# Patient Record
Sex: Female | Born: 1980
Health system: Southern US, Community
[De-identification: ages and names within clinical notes are randomized; demographics above are authoritative.]

## PROBLEM LIST (undated history)

## (undated) DIAGNOSIS — F419 Anxiety disorder, unspecified: Secondary | ICD-10-CM

## (undated) DIAGNOSIS — R350 Frequency of micturition: Secondary | ICD-10-CM

## (undated) DIAGNOSIS — N6082 Other benign mammary dysplasias of left breast: Secondary | ICD-10-CM

## (undated) DIAGNOSIS — T7840XA Allergy, unspecified, initial encounter: Secondary | ICD-10-CM

## (undated) DIAGNOSIS — F32A Depression, unspecified: Secondary | ICD-10-CM

## (undated) HISTORY — DX: Allergy, unspecified, initial encounter: T78.40XA

## (undated) HISTORY — PX: TONSILLECTOMY: SUR1361

## (undated) HISTORY — DX: Frequency of micturition: R35.0

## (undated) HISTORY — PX: WISDOM TOOTH EXTRACTION: SHX21

## (undated) HISTORY — DX: Anxiety disorder, unspecified: F41.9

## (undated) HISTORY — DX: Depression, unspecified: F32.A

---

## 1999-06-29 ENCOUNTER — Other Ambulatory Visit: Admission: RE | Admit: 1999-06-29 | Discharge: 1999-06-29 | Payer: Self-pay | Admitting: *Deleted

## 2000-08-22 ENCOUNTER — Other Ambulatory Visit: Admission: RE | Admit: 2000-08-22 | Discharge: 2000-08-22 | Payer: Self-pay | Admitting: *Deleted

## 2000-09-07 ENCOUNTER — Emergency Department (HOSPITAL_COMMUNITY): Admission: EM | Admit: 2000-09-07 | Discharge: 2000-09-07 | Payer: Self-pay | Admitting: Emergency Medicine

## 2001-09-16 ENCOUNTER — Other Ambulatory Visit: Admission: RE | Admit: 2001-09-16 | Discharge: 2001-09-16 | Payer: Self-pay | Admitting: Obstetrics and Gynecology

## 2004-11-23 ENCOUNTER — Emergency Department (HOSPITAL_COMMUNITY): Admission: EM | Admit: 2004-11-23 | Discharge: 2004-11-23 | Payer: Self-pay | Admitting: Family Medicine

## 2006-09-25 HISTORY — PX: NASAL SINUS SURGERY: SHX719

## 2007-01-16 ENCOUNTER — Ambulatory Visit (HOSPITAL_COMMUNITY): Admission: RE | Admit: 2007-01-16 | Discharge: 2007-01-16 | Payer: Self-pay | Admitting: Otolaryngology

## 2007-07-19 ENCOUNTER — Ambulatory Visit (HOSPITAL_BASED_OUTPATIENT_CLINIC_OR_DEPARTMENT_OTHER): Admission: RE | Admit: 2007-07-19 | Discharge: 2007-07-19 | Payer: Self-pay | Admitting: Otolaryngology

## 2007-07-19 ENCOUNTER — Encounter (INDEPENDENT_AMBULATORY_CARE_PROVIDER_SITE_OTHER): Payer: Self-pay | Admitting: Otolaryngology

## 2007-09-18 ENCOUNTER — Emergency Department (HOSPITAL_COMMUNITY): Admission: EM | Admit: 2007-09-18 | Discharge: 2007-09-18 | Payer: Self-pay | Admitting: Emergency Medicine

## 2008-02-24 ENCOUNTER — Emergency Department (HOSPITAL_COMMUNITY): Admission: EM | Admit: 2008-02-24 | Discharge: 2008-02-24 | Payer: Self-pay | Admitting: Emergency Medicine

## 2008-10-26 ENCOUNTER — Emergency Department (HOSPITAL_COMMUNITY): Admission: EM | Admit: 2008-10-26 | Discharge: 2008-10-26 | Payer: Self-pay | Admitting: Emergency Medicine

## 2011-01-10 LAB — URINALYSIS, ROUTINE W REFLEX MICROSCOPIC
Bilirubin Urine: NEGATIVE
Glucose, UA: NEGATIVE mg/dL
Hgb urine dipstick: NEGATIVE
Ketones, ur: >80 mg/dL — AB
Nitrite: NEGATIVE
Specific Gravity, Urine: 1.028 (ref 1.005–1.030)
pH: 7.5 (ref 5.0–8.0)

## 2011-02-07 NOTE — Op Note (Signed)
Sue Williams, Sue Williams             ACCOUNT NO.:  [id number]   MEDICAL RECORD NO.:  [id number]          PATIENT TYPE:  AMB   LOCATION:  DSC                          FACILITY:  [facility]   PHYSICIAN:  Christopher E. Ezzard Standing, M.D.DATE OF BIRTH:  06/27/1981   DATE OF PROCEDURE:  07/19/2007  DATE OF DISCHARGE:  07/19/2007                               OPERATIVE REPORT   PREOPERATIVE DIAGNOSIS:  Chronic ethmoid and maxillary sinus disease,  right side worse than left, turbinate hypertrophy.   POSTOPERATIVE DIAGNOSIS:  Chronic ethmoid and maxillary sinus disease,  right side worse than left, turbinate hypertrophy.   OPERATION:  Functional endoscopic sinus surgery with bilateral anterior  ethmoidectomy, bilateral maxillary ostial enlargement, bilateral  inferior turbinate reductions.   SURGEON:  Narda Bonds, M.D.   ANESTHESIA:  General endotracheal.   COMPLICATIONS:  None.   BRIEF CLINICAL NOTE:  Sue Williams is a 30 year old female who has  had chronic sinus problems, right side worse than left.  She had a CT  scan which showed mostly bilateral maxillary sinus problems, right side  worse than left, with some obstruction of the [facility] region on the right  side.  She is taken to the operating room at this time for bilateral  anterior ethmoidectomy, bilateral maxillary ostial enlargement and  inferior turbinate reductions.   DESCRIPTION OF PROCEDURE:  After adequate endotracheal anesthesia, the  patient received 1 gram Ancef intraoperatively as well as Decadron.  The  nose was prepped with cotton pledgets soaked in Afrin, and the middle  meatus area was injected with Xylocaine with epinephrine along with the  inferior turbinates.  First, the right side was approached.  Using the  microdebrider, the anterior ethmoid area was opened up.  The uncinate  process was incised with a sickle knife and removed anterior, and a few  of the posterior ethmoid areas were opened up on the right side.   The  maxillary ostia was identified.  This was enlarged with straight through-  cup forceps and backbiting cup forceps.  When opening the maxillary  ostium on the right side, thick mucus was aspirated from the maxillary  sinus.  The anterior and a few of the posterior ethmoid areas was opened  on the right side.  After adequate opening up of the maxillary ostia and  the anterior and a few of the posterior ethmoid areas on the right side,  this side was complete using a bipolar cautery.  The Elmed bipolar  cautery was used to reduce the right inferior turbinate.  The turbinate  was then outfractured.  A Kennedy sinus pack was placed on the right  side.  The left side was approached in a similar fashion.  Uncinate  process was incised.  The anterior and a few of the posterior ethmoid  areas were opened up with a microdebrider.  The left inferior turbinate  was reduced with the Elmed bipolar cautery and then outfractured.  Kennedy sinus pack was placed within the left ethmoid area and hydrated  with Xylocaine with epinephrine.  This completed the procedure.  Sue Williams was awoke from the anesthesia and transferred to  the recovery  room postoperatively doing well.   DISPOSITION:  Sue Williams was discharged home later this morning on  Tylenol and Darvocet-N 100 one to two q.4h. p.r.n. pain, Phenergan 25 mg  q.4h. p.r.n. nausea, Keflex 500 mg b.i.d. for 10 days and will follow up  in my office in 4 days for recheck to have the Kennedy sinus packs  removed.           ______________________________  Kristine Garbe Ezzard Standing, M.D.     CEN/MEDQ  D:  07/19/2007  T:  07/20/2007  Job:  [id number]

## 2011-05-26 ENCOUNTER — Emergency Department (HOSPITAL_COMMUNITY)
Admission: EM | Admit: 2011-05-26 | Discharge: 2011-05-27 | Disposition: A | Discharge status: Home / Self Care | Payer: Self-pay | Attending: Emergency Medicine | Admitting: Emergency Medicine

## 2011-05-26 DIAGNOSIS — R5383 Other fatigue: Secondary | ICD-10-CM | POA: Insufficient documentation

## 2011-05-26 DIAGNOSIS — R05 Cough: Secondary | ICD-10-CM | POA: Insufficient documentation

## 2011-05-26 DIAGNOSIS — R51 Headache: Secondary | ICD-10-CM | POA: Insufficient documentation

## 2011-05-26 DIAGNOSIS — R509 Fever, unspecified: Secondary | ICD-10-CM | POA: Insufficient documentation

## 2011-05-26 DIAGNOSIS — R5381 Other malaise: Secondary | ICD-10-CM | POA: Insufficient documentation

## 2011-05-26 DIAGNOSIS — R059 Cough, unspecified: Secondary | ICD-10-CM | POA: Insufficient documentation

## 2011-05-27 ENCOUNTER — Emergency Department (HOSPITAL_COMMUNITY): Payer: Self-pay

## 2011-05-27 LAB — POCT PREGNANCY, URINE: Preg Test, Ur: NEGATIVE

## 2011-05-27 LAB — URINALYSIS, ROUTINE W REFLEX MICROSCOPIC
Ketones, ur: NEGATIVE mg/dL
Nitrite: NEGATIVE
pH: 5.5 (ref 5.0–8.0)

## 2011-05-27 LAB — URINE MICROSCOPIC-ADD ON

## 2011-06-30 LAB — POCT RAPID STREP A: Streptococcus, Group A Screen (Direct): NEGATIVE

## 2011-07-05 LAB — POCT HEMOGLOBIN-HEMACUE: Operator id: 123881

## 2013-08-01 ENCOUNTER — Encounter: Payer: Self-pay | Admitting: Nurse Practitioner

## 2013-08-15 ENCOUNTER — Encounter: Payer: Self-pay | Admitting: Nurse Practitioner

## 2013-08-15 ENCOUNTER — Ambulatory Visit (INDEPENDENT_AMBULATORY_CARE_PROVIDER_SITE_OTHER): Payer: 59 | Admitting: Nurse Practitioner

## 2013-08-15 VITALS — BP 100/60 | HR 82 | Resp 18 | Ht 67.0 in | Wt 139.0 lb

## 2013-08-15 DIAGNOSIS — Z0000 Encounter for general adult medical examination without abnormal findings: Secondary | ICD-10-CM

## 2013-08-15 DIAGNOSIS — Z01419 Encounter for gynecological examination (general) (routine) without abnormal findings: Secondary | ICD-10-CM

## 2013-08-15 LAB — HEMOGLOBIN, FINGERSTICK: Hemoglobin, fingerstick: 14.8 g/dL (ref 12.0–16.0)

## 2013-08-15 MED ORDER — NORETHINDRONE 0.35 MG PO TABS: 1.0000 | ORAL_TABLET | Freq: Every day | ORAL | Status: DC

## 2013-08-15 NOTE — Patient Instructions (Signed)

## 2013-08-15 NOTE — Progress Notes (Signed)
32 y.o. G2P0020 Single Caucasian Fe here for annual exam. This patient is a 1 pack per day smoker.  In past she has used Nuva ring and did well.  Non compliant to daily method birth control in the past.  Her menses last 3 days moderate flow with every other month a little heavier.  Some PMS.  Menses started last pm. Same partner for two years.  Last STD's a year ago was negative.  Maybe long term relationship and wanting pregnancy in the future. Also has a sebaceous cyst on left breast for a few days.  It is getting better since being on antibiotics for pneumonia.  Patient's last menstrual period was 08/15/2013.          Sexually active: yes  The current method of family planning is none.    Exercising: yes  Cardio, Running 3-4 days a week Health Maintenance: Pap:  2010 History of abnormal Pap:  no TDaP:  Current Screening Labs:   Hb today: 14.8, Urine today: 50 blood (on menstrual cycle)   reports that she has been smoking Cigarettes.  She has been smoking about 1.00 pack per day. She has never used smokeless tobacco. She reports that she does not drink alcohol or use illicit drugs.  History reviewed. No pertinent past medical history.  Past Surgical History  Procedure Laterality Date  . Nasal sinus surgery  2008  . Wisdom tooth extraction    . Tonsillectomy      Current Outpatient Prescriptions  Medication Sig Dispense Refill  . chlorpheniramine-HYDROcodone (TUSSIONEX) 10-8 MG/5ML LQCR       . fluconazole (DIFLUCAN) 150 MG tablet       . levofloxacin (LEVAQUIN) 500 MG tablet       . norethindrone (MICRONOR,CAMILA,ERRIN) 0.35 MG tablet Take 1 tablet (0.35 mg total) by mouth daily.  3 Package  3  . PREVIDENT 5000 PLUS 1.1 % CREA dental cream        No current facility-administered medications for this visit.    Family History  Problem Relation Age of Onset  . Heart disease Father   . Diabetes Maternal Grandmother   . Heart disease Maternal Grandfather     ROS:  Pertinent  items are noted in HPI.  Otherwise, a comprehensive ROS was negative.  Exam:   BP 100/60  Pulse 82  Resp 18  Ht 5\' 7"  (1.702 m)  Wt 139 lb (63.05 kg)  BMI 21.77 kg/m2  LMP 08/15/2013  Weight change: @WEIGHT  CHANGE@ Height:   Height: 5\' 7"  (170.2 cm)  Ht Readings from Last 3 Encounters:  08/15/13 5\' 7"  (1.702 m)    General appearance: alert, cooperative and appears stated age Head: Normocephalic, without obvious abnormality, atraumatic Neck: no adenopathy, supple, symmetrical, trachea midline and thyroid normal to inspection and palpation Lungs: clear to auscultation bilaterally Breasts: normal appearance, no masses or tenderness, positive findings: left breast there is a superficial sebaceous cyst without exudate and is improved per patietn. It is located at the aerola  at 2:00 position at a montgomery gland. Heart: regular rate and rhythm Abdomen: soft, non-tender; bowel sounds normal; no masses,  no organomegaly Extremities: extremities normal, atraumatic, no cyanosis or edema Skin: Skin color, texture, turgor normal. No rashes or lesions Lymph nodes: Cervical, supraclavicular, and axillary nodes normal. No abnormal inguinal nodes palpated Neurologic: Grossly normal   Pelvic: External genitalia:  no lesions              Urethra:  normal appearing urethra with  no masses, tenderness or lesions              Bartholin's and Skene's: normal                 Vagina: normal appearing vagina with normal color and moderate vaginal bleeding, no lesions              Cervix: anteverted              Pap taken: yes Bimanual Exam:  Uterus:  normal size, contour, position, consistency, mobility, non-tender              Adnexa: no mass, fullness, tenderness               Rectovaginal: Confirms               Anus:  normal sphincter tone, no lesions  A:  Well Woman with normal exam  Contraceptive needs  Smoker  Sebaceous cyst left areola that is getting better on antibiotics.    P:   Pap  smear with guidelines done today  Discussion of various methods of birth control including POP, IUD, Implanon, Depo Provera.  Will start on Micronor for birth control  - reviewed potential side effects, start date, and BUM  Counseled on breast self exam, adequate intake of calcium and vitamin D, diet and exercise  If area on breast does not go away to call back.  Will use warm compresses prn and not to squeeze any further. return annually or prn  An After Visit Summary was printed and given to the patient.

## 2013-08-18 NOTE — Progress Notes (Signed)
Encounter reviewed by Dr. Janaki Exley Silva.  

## 2013-08-19 LAB — IPS PAP TEST WITH HPV

## 2014-06-21 ENCOUNTER — Other Ambulatory Visit: Payer: Self-pay | Admitting: Nurse Practitioner

## 2014-06-29 ENCOUNTER — Telehealth: Payer: Self-pay | Admitting: Nurse Practitioner

## 2014-06-29 MED ORDER — NORETHINDRONE 0.35 MG PO TABS: 1.0000 | ORAL_TABLET | Freq: Every day | ORAL | Status: DC

## 2014-06-29 NOTE — Telephone Encounter (Signed)
norethindrone (MICRONOR,CAMILA,ERRIN) 0.35 MG tablet  Take 1 tablet (0.35 mg total) by mouth daily., Starting 08/15/2013, Until Discontinued, Normal, Last Dose: Not Recorded   Patient called to requests refill on the above medication. AEX 08/28/14 with Ria CommentPatricia Grubb, NP.  [facility]

## 2014-06-29 NOTE — Telephone Encounter (Signed)
Last refilled/AEX: 08/15/13 with Ms. Patty #3 packs with 3 rfs AEX Scheduled: 08/28/14 with Ms. Patty  Norethindrone 0.35 #3 packs with no rfs sent to [facility].  Patient is aware.  Routed to provider for review, encounter closed.

## 2014-07-27 ENCOUNTER — Encounter: Payer: Self-pay | Admitting: Nurse Practitioner

## 2014-08-28 ENCOUNTER — Ambulatory Visit: Payer: 59 | Admitting: Nurse Practitioner

## 2014-09-10 ENCOUNTER — Telehealth: Payer: Self-pay | Admitting: Nurse Practitioner

## 2014-09-10 ENCOUNTER — Ambulatory Visit: Payer: 59 | Admitting: Nurse Practitioner

## 2014-09-10 NOTE — Telephone Encounter (Signed)
Patient canceled her aex appointment today due to illness. Patient rescheduled to 10/01/2014.

## 2014-10-01 ENCOUNTER — Ambulatory Visit (INDEPENDENT_AMBULATORY_CARE_PROVIDER_SITE_OTHER): Payer: Commercial Managed Care - PPO | Admitting: Nurse Practitioner

## 2014-10-01 ENCOUNTER — Encounter: Payer: Self-pay | Admitting: Nurse Practitioner

## 2014-10-01 VITALS — BP 94/66 | HR 56 | Ht 67.0 in | Wt 137.0 lb

## 2014-10-01 DIAGNOSIS — R319 Hematuria, unspecified: Secondary | ICD-10-CM

## 2014-10-01 DIAGNOSIS — Z01419 Encounter for gynecological examination (general) (routine) without abnormal findings: Secondary | ICD-10-CM

## 2014-10-01 DIAGNOSIS — N6002 Solitary cyst of left breast: Secondary | ICD-10-CM

## 2014-10-01 DIAGNOSIS — Z0000 Encounter for general adult medical examination without abnormal findings: Secondary | ICD-10-CM

## 2014-10-01 LAB — POCT URINALYSIS DIPSTICK
Bilirubin, UA: NEGATIVE
GLUCOSE UA: NEGATIVE
Ketones, UA: NEGATIVE
LEUKOCYTES UA: NEGATIVE
Nitrite, UA: NEGATIVE
PH UA: 7
Protein, UA: NEGATIVE
UROBILINOGEN UA: NEGATIVE

## 2014-10-01 MED ORDER — NORETHINDRONE 0.35 MG PO TABS: 1.0000 | ORAL_TABLET | Freq: Every day | ORAL | Status: DC

## 2014-10-01 NOTE — Progress Notes (Signed)
Patient ID: Sue Williams, female   DOB: 06-25-1981, 34 y.o.   MRN: [id number] 34 y.o. G2P0020 Single Caucasian Fe here for annual exam.  Menses since on Micronor.  Last  for 3-4 days moderate to light. Same partner for 4 years.  Patient's last menstrual period was 09/23/2014 (exact date).          Sexually active: yes  The current method of family planning is POP (progesterone only).  Exercising: yes Cardio, Running 3-4 days a week Smoking:  Yes, 1/2 ppd  Health Maintenance: Pap: 08/15/13, negative with neg HR HPV History of abnormal Pap: no TDaP: Current Screening Labs:  HB:  14.3  Urine:  Trace RBC   reports that she has been smoking Cigarettes.  She has been smoking about 1.00 pack per day. She has never used smokeless tobacco. She reports that she does not drink alcohol or use illicit drugs.  History reviewed. No pertinent past medical history.  Past Surgical History  Procedure Laterality Date  . Nasal sinus surgery  2008  . Wisdom tooth extraction    . Tonsillectomy      Current Outpatient Prescriptions  Medication Sig Dispense Refill  . norethindrone (MICRONOR,CAMILA,ERRIN) 0.35 MG tablet Take 1 tablet (0.35 mg total) by mouth daily. 3 Package 3   No current facility-administered medications for this visit.    Family History  Problem Relation Age of Onset  . Heart disease Father   . Diabetes Maternal Grandmother   . Heart disease Maternal Grandfather     ROS:  Pertinent items are noted in HPI.  Otherwise, a comprehensive ROS was negative.  Exam:   BP 94/66 mmHg  Pulse 56  Ht 5\' 7"  (1.702 m)  Wt 137 lb (62.143 kg)  BMI 21.45 kg/m2  LMP 09/23/2014 (Exact Date) Height: 5\' 7"  (170.2 cm)  Ht Readings from Last 3 Encounters:  10/01/14 5\' 7"  (1.702 m)  08/15/13 5\' 7"  (1.702 m)    General appearance: alert, cooperative and appears stated age Head: Normocephalic, without obvious abnormality, atraumatic Neck: no adenopathy, supple, symmetrical, trachea  midline and thyroid normal to inspection and palpation Lungs: clear to auscultation bilaterally Breasts: normal appearance, no masses or tenderness Heart: regular rate and rhythm Abdomen: soft, non-tender; no masses,  no organomegaly Extremities: extremities normal, atraumatic, no cyanosis or edema Skin: Skin color, texture, turgor normal. No rashes or lesions Lymph nodes: Cervical, supraclavicular, and axillary nodes normal. No abnormal inguinal nodes palpated Neurologic: Grossly normal   Pelvic: External genitalia:  no lesions              Urethra:  normal appearing urethra with no masses, tenderness or lesions              Bartholin's and Skene's: normal                 Vagina: normal appearing vagina with normal color and discharge, no lesions              Cervix: anteverted              Pap taken: No. Bimanual Exam:  Uterus:  normal size, contour, position, consistency, mobility, non-tender              Adnexa: no mass, fullness, tenderness               Rectovaginal: Confirms               Anus:  normal sphincter tone, no lesions  A:  Well Woman with normal exam  Contraceptive with POP Smoker  Hematuria Sebaceous cyst left areola that was removed by dermatologist - still present and wants removed.  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

## 2014-10-01 NOTE — Patient Instructions (Signed)

## 2014-10-02 ENCOUNTER — Telehealth: Payer: Self-pay | Admitting: Nurse Practitioner

## 2014-10-02 LAB — URINALYSIS, MICROSCOPIC ONLY
Bacteria, UA: NONE SEEN
CRYSTALS: NONE SEEN
Casts: NONE SEEN

## 2014-10-02 LAB — HEMOGLOBIN, FINGERSTICK: HEMOGLOBIN, FINGERSTICK: 14.3 g/dL (ref 12.0–16.0)

## 2014-10-02 NOTE — Telephone Encounter (Signed)
[facility]. Message left to return call to [facility] at [phone].  Office visit notes faxed.

## 2014-10-02 NOTE — Telephone Encounter (Signed)
Lamar LaundrySonya from [facility] calling to find out if patient has recent mammogram or note concerning breast cyst. [phone]

## 2014-10-04 NOTE — Progress Notes (Signed)
Encounter reviewed by Dr. Arlester Keehan Silva.  

## 2014-10-05 ENCOUNTER — Telehealth: Payer: Self-pay | Admitting: Emergency Medicine

## 2014-10-05 NOTE — Telephone Encounter (Signed)
Patient returned call, information given.  She will call back with any concerns.  Routing to provider for final review. Patient agreeable to disposition. Will close encounter

## 2014-10-05 NOTE — Telephone Encounter (Signed)
Message left to return call to [facility] at [phone].   Spoke with Lamar LaundrySonya from [facility]. She wanted to confirm patient did not have abcess or need emergency appointment with Dr. Donell BeersByerly.   Spoke with Lauro FranklinPatricia Rolen-Grubb, FNP, advised appointment needed not urgent. Feels cyst is superficial and no imaging required at this time.   [facility] with Dr. Donell BeersByerly for 10-30-14 at 0830. Bring ID and insurance cards. Call [phone] if needs to change appointment.

## 2014-11-02 ENCOUNTER — Other Ambulatory Visit (INDEPENDENT_AMBULATORY_CARE_PROVIDER_SITE_OTHER): Payer: Self-pay | Admitting: General Surgery

## 2014-12-02 ENCOUNTER — Encounter (HOSPITAL_BASED_OUTPATIENT_CLINIC_OR_DEPARTMENT_OTHER): Payer: Self-pay | Admitting: *Deleted

## 2014-12-10 ENCOUNTER — Ambulatory Visit (HOSPITAL_BASED_OUTPATIENT_CLINIC_OR_DEPARTMENT_OTHER): Payer: Commercial Managed Care - PPO | Admitting: Certified Registered"

## 2014-12-10 ENCOUNTER — Encounter (HOSPITAL_BASED_OUTPATIENT_CLINIC_OR_DEPARTMENT_OTHER): Payer: Self-pay | Admitting: Certified Registered"

## 2014-12-10 ENCOUNTER — Ambulatory Visit (HOSPITAL_BASED_OUTPATIENT_CLINIC_OR_DEPARTMENT_OTHER)
Admission: RE | Admit: 2014-12-10 | Discharge: 2014-12-10 | Disposition: A | Discharge status: Home / Self Care | Payer: Commercial Managed Care - PPO | Source: Ambulatory Visit | Attending: General Surgery | Admitting: General Surgery

## 2014-12-10 ENCOUNTER — Encounter (HOSPITAL_BASED_OUTPATIENT_CLINIC_OR_DEPARTMENT_OTHER)
Admission: RE | Disposition: A | Discharge status: Home / Self Care | Payer: Self-pay | Source: Ambulatory Visit | Attending: General Surgery

## 2014-12-10 DIAGNOSIS — N6082 Other benign mammary dysplasias of left breast: Secondary | ICD-10-CM | POA: Diagnosis not present

## 2014-12-10 DIAGNOSIS — F17210 Nicotine dependence, cigarettes, uncomplicated: Secondary | ICD-10-CM | POA: Diagnosis not present

## 2014-12-10 DIAGNOSIS — N63 Unspecified lump in breast: Secondary | ICD-10-CM | POA: Diagnosis present

## 2014-12-10 DIAGNOSIS — Z888 Allergy status to other drugs, medicaments and biological substances status: Secondary | ICD-10-CM | POA: Diagnosis not present

## 2014-12-10 DIAGNOSIS — Z886 Allergy status to analgesic agent status: Secondary | ICD-10-CM | POA: Diagnosis not present

## 2014-12-10 HISTORY — PX: BREAST CYST EXCISION: SHX579

## 2014-12-10 HISTORY — DX: Other benign mammary dysplasias of left breast: N60.82

## 2014-12-10 SURGERY — EXCISION, CYST, BREAST
Anesthesia: General | Site: Breast | Laterality: Left

## 2014-12-10 MED ORDER — LIDOCAINE-EPINEPHRINE (PF) 1 %-1:200000 IJ SOLN
INTRAMUSCULAR | Status: AC
  Filled 2014-12-10: qty 10

## 2014-12-10 MED ORDER — MIDAZOLAM HCL 5 MG/5ML IJ SOLN
INTRAMUSCULAR | Status: DC | PRN
  Administered 2014-12-10: 2 mg via INTRAVENOUS

## 2014-12-10 MED ORDER — SODIUM CHLORIDE 0.9 % IJ SOLN: 3.0000 mL | Freq: Two times a day (BID) | INTRAMUSCULAR | Status: DC

## 2014-12-10 MED ORDER — PROMETHAZINE HCL 25 MG/ML IJ SOLN: 6.2500 mg | INTRAMUSCULAR | Status: DC | PRN

## 2014-12-10 MED ORDER — OXYCODONE HCL 5 MG/5ML PO SOLN: 5.0000 mg | Freq: Once | ORAL | Status: AC | PRN

## 2014-12-10 MED ORDER — KETOROLAC TROMETHAMINE 30 MG/ML IJ SOLN
30.0000 mg | Freq: Once | INTRAMUSCULAR | Status: AC | PRN
  Administered 2014-12-10: 30 mg via INTRAVENOUS

## 2014-12-10 MED ORDER — LACTATED RINGERS IV SOLN
INTRAVENOUS | Status: DC
  Administered 2014-12-10: 10 mL/h via INTRAVENOUS
  Administered 2014-12-10: 10:00:00 via INTRAVENOUS

## 2014-12-10 MED ORDER — PROPOFOL 10 MG/ML IV EMUL
INTRAVENOUS | Status: AC
  Filled 2014-12-10: qty 50

## 2014-12-10 MED ORDER — CEFAZOLIN SODIUM-DEXTROSE 2-3 GM-% IV SOLR
INTRAVENOUS | Status: DC | PRN
  Administered 2014-12-10: 2 g via INTRAVENOUS

## 2014-12-10 MED ORDER — BUPIVACAINE HCL (PF) 0.25 % IJ SOLN
INTRAMUSCULAR | Status: DC | PRN
  Administered 2014-12-10: 6 mL

## 2014-12-10 MED ORDER — SODIUM CHLORIDE 0.9 % IJ SOLN: 3.0000 mL | INTRAMUSCULAR | Status: DC | PRN

## 2014-12-10 MED ORDER — OXYCODONE HCL 5 MG PO TABS
5.0000 mg | ORAL_TABLET | Freq: Once | ORAL | Status: AC | PRN
  Administered 2014-12-10: 5 mg via ORAL

## 2014-12-10 MED ORDER — BUPIVACAINE HCL (PF) 0.25 % IJ SOLN
INTRAMUSCULAR | Status: AC
  Filled 2014-12-10: qty 30

## 2014-12-10 MED ORDER — FENTANYL CITRATE 0.05 MG/ML IJ SOLN
INTRAMUSCULAR | Status: DC | PRN
  Administered 2014-12-10: 50 ug via INTRAVENOUS
  Administered 2014-12-10: 100 ug via INTRAVENOUS
  Administered 2014-12-10 (×3): 50 ug via INTRAVENOUS

## 2014-12-10 MED ORDER — CEFAZOLIN SODIUM-DEXTROSE 2-3 GM-% IV SOLR
INTRAVENOUS | Status: AC
  Filled 2014-12-10: qty 50

## 2014-12-10 MED ORDER — ACETAMINOPHEN 325 MG PO TABS: 650.0000 mg | ORAL_TABLET | ORAL | Status: DC | PRN

## 2014-12-10 MED ORDER — HYDROMORPHONE HCL 1 MG/ML IJ SOLN: 0.2500 mg | INTRAMUSCULAR | Status: DC | PRN

## 2014-12-10 MED ORDER — FENTANYL CITRATE 0.05 MG/ML IJ SOLN
INTRAMUSCULAR | Status: AC
  Filled 2014-12-10: qty 6

## 2014-12-10 MED ORDER — ACETAMINOPHEN 650 MG RE SUPP: 650.0000 mg | RECTAL | Status: DC | PRN

## 2014-12-10 MED ORDER — MIDAZOLAM HCL 2 MG/2ML IJ SOLN
INTRAMUSCULAR | Status: AC
  Filled 2014-12-10: qty 2

## 2014-12-10 MED ORDER — FENTANYL CITRATE 0.05 MG/ML IJ SOLN: 50.0000 ug | INTRAMUSCULAR | Status: DC | PRN

## 2014-12-10 MED ORDER — OXYCODONE-ACETAMINOPHEN 5-325 MG PO TABS: 1.0000 | ORAL_TABLET | Freq: Four times a day (QID) | ORAL | Status: DC | PRN

## 2014-12-10 MED ORDER — MIDAZOLAM HCL 2 MG/2ML IJ SOLN: 1.0000 mg | INTRAMUSCULAR | Status: DC | PRN

## 2014-12-10 MED ORDER — LIDOCAINE HCL (CARDIAC) 20 MG/ML IV SOLN
INTRAVENOUS | Status: DC | PRN
  Administered 2014-12-10: 50 mg via INTRAVENOUS

## 2014-12-10 MED ORDER — ONDANSETRON HCL 4 MG/2ML IJ SOLN
INTRAMUSCULAR | Status: DC | PRN
  Administered 2014-12-10: 4 mg via INTRAVENOUS

## 2014-12-10 MED ORDER — SODIUM CHLORIDE 0.9 % IV SOLN: 250.0000 mL | INTRAVENOUS | Status: DC | PRN

## 2014-12-10 MED ORDER — PROPOFOL 10 MG/ML IV BOLUS
INTRAVENOUS | Status: DC | PRN
  Administered 2014-12-10: 150 mg via INTRAVENOUS

## 2014-12-10 MED ORDER — OXYCODONE HCL 5 MG PO TABS
ORAL_TABLET | ORAL | Status: AC
  Filled 2014-12-10: qty 1

## 2014-12-10 MED ORDER — CEFAZOLIN SODIUM-DEXTROSE 2-3 GM-% IV SOLR: 2.0000 g | INTRAVENOUS | Status: DC

## 2014-12-10 MED ORDER — DEXAMETHASONE SODIUM PHOSPHATE 4 MG/ML IJ SOLN
INTRAMUSCULAR | Status: DC | PRN
  Administered 2014-12-10: 10 mg via INTRAVENOUS

## 2014-12-10 MED ORDER — OXYCODONE HCL 5 MG PO TABS: 5.0000 mg | ORAL_TABLET | ORAL | Status: DC | PRN

## 2014-12-10 MED ORDER — LIDOCAINE-EPINEPHRINE (PF) 1 %-1:200000 IJ SOLN
INTRAMUSCULAR | Status: DC | PRN
  Administered 2014-12-10: 6 mL

## 2014-12-10 MED ORDER — KETOROLAC TROMETHAMINE 30 MG/ML IJ SOLN
INTRAMUSCULAR | Status: AC
  Filled 2014-12-10: qty 1

## 2014-12-10 MED ORDER — LIDOCAINE-EPINEPHRINE 0.5 %-1:200000 IJ SOLN
INTRAMUSCULAR | Status: AC
  Filled 2014-12-10: qty 1

## 2014-12-10 SURGICAL SUPPLY — 57 items
BINDER BREAST MEDIUM (GAUZE/BANDAGES/DRESSINGS) ×1 IMPLANT
BLADE HEX COATED 2.75 (ELECTRODE) ×3 IMPLANT
BLADE SURG 10 STRL SS (BLADE) ×2 IMPLANT
BLADE SURG 15 STRL LF DISP TIS (BLADE) ×1 IMPLANT
BLADE SURG 15 STRL SS (BLADE) ×2
CANISTER SUCT 1200ML W/VALVE (MISCELLANEOUS) ×2 IMPLANT
CHLORAPREP W/TINT 26ML (MISCELLANEOUS) ×2 IMPLANT
CLIP TI LARGE 6 (CLIP) IMPLANT
COVER BACK TABLE 60X90IN (DRAPES) ×2 IMPLANT
COVER MAYO STAND STRL (DRAPES) ×2 IMPLANT
DECANTER SPIKE VIAL GLASS SM (MISCELLANEOUS) IMPLANT
DEVICE DUBIN W/COMP PLATE 8390 (MISCELLANEOUS) IMPLANT
DRAPE LAPAROTOMY 100X72 PEDS (DRAPES) ×2 IMPLANT
DRAPE UTILITY XL STRL (DRAPES) ×2 IMPLANT
DRSG PAD ABDOMINAL 8X10 ST (GAUZE/BANDAGES/DRESSINGS) ×1 IMPLANT
ELECT REM PT RETURN 9FT ADLT (ELECTROSURGICAL) ×2
ELECTRODE REM PT RTRN 9FT ADLT (ELECTROSURGICAL) ×1 IMPLANT
GLOVE BIO SURGEON STRL SZ 6 (GLOVE) ×2 IMPLANT
GLOVE BIO SURGEON STRL SZ8.5 (GLOVE) ×1 IMPLANT
GLOVE BIOGEL PI IND STRL 6.5 (GLOVE) ×1 IMPLANT
GLOVE BIOGEL PI IND STRL 7.0 (GLOVE) IMPLANT
GLOVE BIOGEL PI IND STRL 8.5 (GLOVE) IMPLANT
GLOVE BIOGEL PI INDICATOR 6.5 (GLOVE) ×1
GLOVE BIOGEL PI INDICATOR 7.0 (GLOVE) ×1
GLOVE BIOGEL PI INDICATOR 8.5 (GLOVE) ×1
GLOVE ECLIPSE 6.5 STRL STRAW (GLOVE) ×1 IMPLANT
GOWN STRL REUS W/ TWL LRG LVL3 (GOWN DISPOSABLE) ×1 IMPLANT
GOWN STRL REUS W/ TWL XL LVL3 (GOWN DISPOSABLE) IMPLANT
GOWN STRL REUS W/TWL 2XL LVL3 (GOWN DISPOSABLE) ×2 IMPLANT
GOWN STRL REUS W/TWL LRG LVL3 (GOWN DISPOSABLE) ×2
GOWN STRL REUS W/TWL XL LVL3 (GOWN DISPOSABLE) ×2
KIT MARKER MARGIN INK (KITS) IMPLANT
LIQUID BAND (GAUZE/BANDAGES/DRESSINGS) ×1 IMPLANT
NDL HYPO 25X1 1.5 SAFETY (NEEDLE) ×1 IMPLANT
NEEDLE HYPO 25X1 1.5 SAFETY (NEEDLE) ×2 IMPLANT
NS IRRIG 1000ML POUR BTL (IV SOLUTION) ×2 IMPLANT
PACK BASIN DAY SURGERY FS (CUSTOM PROCEDURE TRAY) ×2 IMPLANT
PENCIL BUTTON HOLSTER BLD 10FT (ELECTRODE) ×2 IMPLANT
SLEEVE SCD COMPRESS KNEE MED (MISCELLANEOUS) ×2 IMPLANT
SPONGE GAUZE 4X4 12PLY STER LF (GAUZE/BANDAGES/DRESSINGS) ×3 IMPLANT
SPONGE LAP 18X18 X RAY DECT (DISPOSABLE) ×2 IMPLANT
STAPLER VISISTAT 35W (STAPLE) IMPLANT
STRIP CLOSURE SKIN 1/2X4 (GAUZE/BANDAGES/DRESSINGS) ×2 IMPLANT
SUT MON AB 4-0 PC3 18 (SUTURE) ×2 IMPLANT
SUT SILK 2 0 SH (SUTURE) IMPLANT
SUT VIC AB 2-0 SH 27 (SUTURE)
SUT VIC AB 2-0 SH 27XBRD (SUTURE) IMPLANT
SUT VIC AB 3-0 54X BRD REEL (SUTURE) IMPLANT
SUT VIC AB 3-0 BRD 54 (SUTURE)
SUT VIC AB 3-0 SH 27 (SUTURE) ×2
SUT VIC AB 3-0 SH 27X BRD (SUTURE) ×1 IMPLANT
SYR BULB 3OZ (MISCELLANEOUS) ×2 IMPLANT
SYR CONTROL 10ML LL (SYRINGE) ×2 IMPLANT
TOWEL OR 17X24 6PK STRL BLUE (TOWEL DISPOSABLE) ×2 IMPLANT
TOWEL OR NON WOVEN STRL DISP B (DISPOSABLE) ×2 IMPLANT
TUBE CONNECTING 20X1/4 (TUBING) ×2 IMPLANT
YANKAUER SUCT BULB TIP NO VENT (SUCTIONS) ×1 IMPLANT

## 2014-12-10 NOTE — Anesthesia Postprocedure Evaluation (Signed)
  Anesthesia Post-op Note  Patient: Sue Williams  Procedure(s) Performed: Procedure(s): CYST EXCISION LEFT BREAST AEREOLA (Left)  Patient Location: PACU  Anesthesia Type:General  Level of Consciousness: awake and alert   Airway and Oxygen Therapy: Patient Spontanous Breathing  Post-op Pain: mild  Post-op Assessment: Post-op Vital signs reviewed  Post-op Vital Signs: Reviewed  Last Vitals:  Filed Vitals:   12/10/14 1115  BP: 126/72  Pulse: 90  Temp: 37.1 C  Resp: 16    Complications: No apparent anesthesia complications

## 2014-12-10 NOTE — Interval H&P Note (Signed)
History and Physical Interval Note:  12/10/2014 9:32 AM  Sue Williams  has presented today for surgery, with the diagnosis of SEBACEOUS CYST LEFT AREOLA  The various methods of treatment have been discussed with the patient and family. After consideration of risks, benefits and other options for treatment, the patient has consented to  Procedure(s): CYST EXCISION LEFT BREAST AEREOLA (Left) as a surgical intervention .  The patient's history has been reviewed, patient examined, no change in status, stable for surgery.  I have reviewed the patient's chart and labs.  Questions were answered to the patient's satisfaction.     Breckin Zafar

## 2014-12-10 NOTE — H&P (Signed)
Sue Sherlyn HayJ. Sue Williams  [facility] Patient #: [id number] DOB: 1980/12/15 Single / Language: [place] / Race: White Female  History of Present Illness  Patient words: eval cyst in breast.  The patient is a [age] year old female who presents with a breast mass. This patient is referred by Farrell OursPat Rowland Grubb for consultation regarding a sebaceous cyst on the left breast. The patient is a 34 year old female who has had a sebaceous cyst on her left areola for approximately 6 months. Her dermatologist and incised it and drained it. The skin has never healed since then. It continues to periodically drain cheesy material. She denies any bright red skin around it. It is not painful. She has not had lesions like this before.   Other Problems  No pertinent past medical history  Past Surgical History  Tonsillectomy  Diagnostic Studies History Colonoscopy never Mammogram never Pap Smear 1-5 years ago  Allergies Codeine Phosphate *ANALGESICS - OPIOID* Demerol *ANALGESICS - OPIOID*  Medication History  Nora-BE (0.35MG  Tablet, Oral daily) Active. Biotin (1000MCG Tablet, Oral daily) Active. Multivitamins (Oral daily) Active.  Social History  Alcohol use Occasional alcohol use. Caffeine use Tea. No drug use Tobacco use Current some day smoker.  Family History Heart Disease Father. Hypertension Father.  Pregnancy / Birth History  Age at menarche 11 years. Contraceptive History Oral contraceptives. Gravida 0 Para 0 Regular periods  Review of Systems General Not Present- Appetite Loss, Chills, Fatigue, Fever, Night Sweats, Weight Gain and Weight Loss. Skin Present- Non-Healing Wounds. Not Present- Change in Wart/Mole, Dryness, Hives, Jaundice, New Lesions, Rash and Ulcer. HEENT Not Present- Earache, Hearing Loss, Hoarseness, Nose Bleed, Oral Ulcers, Ringing in the Ears, Seasonal Allergies, Sinus Pain, Sore Throat, Visual Disturbances, Wears  glasses/contact lenses and Yellow Eyes. Respiratory Not Present- Bloody sputum, Chronic Cough, Difficulty Breathing, Snoring and Wheezing. Breast Not Present- Breast Mass, Breast Pain, Nipple Discharge and Skin Changes. Cardiovascular Not Present- Chest Pain, Difficulty Breathing Lying Down, Leg Cramps, Palpitations, Rapid Heart Rate, Shortness of Breath and Swelling of Extremities. Gastrointestinal Not Present- Abdominal Pain, Bloating, Bloody Stool, Change in Bowel Habits, Chronic diarrhea, Constipation, Difficulty Swallowing, Excessive gas, Gets full quickly at meals, Hemorrhoids, Indigestion, Nausea, Rectal Pain and Vomiting. Female Genitourinary Not Present- Frequency, Nocturia, Painful Urination, Pelvic Pain and Urgency. Musculoskeletal Not Present- Back Pain, Joint Pain, Joint Stiffness, Muscle Pain, Muscle Weakness and Swelling of Extremities. Neurological Not Present- Decreased Memory, Fainting, Headaches, Numbness, Seizures, Tingling, Tremor, Trouble walking and Weakness. Psychiatric Not Present- Anxiety, Bipolar, Change in Sleep Pattern, Depression, Fearful and Frequent crying. Endocrine Not Present- Cold Intolerance, Excessive Hunger, Hair Changes, Heat Intolerance, Hot flashes and New Diabetes. Hematology Not Present- Easy Bruising, Excessive bleeding, Gland problems, HIV and Persistent Infections.   Vitals  Weight: 138.2 lb Height: 67in Body Surface Area: 1.72 m Body Mass Index: 21.64 kg/m Temp.: 98.55F(Oral)  Pulse: 64 (Regular)  Resp.: 12 (Unlabored)  BP: 108/76 (Sitting, Left Arm, Standard)    Physical Exam  General Mental Status-Alert. General Appearance-Consistent with stated age. Hydration-Well hydrated. Voice-Normal.  Head and Neck Head-normocephalic, atraumatic with no lesions or palpable masses.  Eye Sclera/Conjunctiva - Bilateral-No scleral icterus.  Chest and Lung Exam Chest and lung exam reveals -quiet, even and easy  respiratory effort with no use of accessory muscles. Inspection Chest Wall - Normal. Back - normal.  Breast Note: Breasts are symmetric bilaterally. The left breast has an open wound at approximately 12:30. There is no erythema around this. There is a pea-sized mass in  this location. I cannot express any drainage from it. Breasts are dense bilaterally. There is no palpable lymphadenopathy.   Cardiovascular Cardiovascular examination reveals -normal pedal pulses bilaterally. Note: regular rate and rhythm  Peripheral Vascular Upper Extremity Inspection - Bilateral - Normal - No Clubbing, No Cyanosis, No Edema, Pulses Intact. Lower Extremity Palpation - Edema - Bilateral - No edema.  Neurologic Neurologic evaluation reveals -alert and oriented x 3 with no impairment of recent or remote memory. Mental Status-Normal.  Musculoskeletal Global Assessment -Note: no gross deformities.  Normal Exam - Left-Upper Extremity Strength Normal and Lower Extremity Strength Normal. Normal Exam - Right-Upper Extremity Strength Normal and Lower Extremity Strength Normal.  Lymphatic Head & Neck  General Head & Neck Lymphatics: Bilateral - Description - Normal. Axillary  General Axillary Region: Bilateral - Description - Normal. Tenderness - Non Tender.    Assessment & Plan SEBACEOUS CYST OF SKIN OF LEFT BREAST (610.8  N60.82) Impression: We'll plan to excise the skin and the lesion. I think this would be best performed in the operating room since it is on the areola. This area is much more sensitive than the remainder of the breast. I also think that she will have a decreased risk of postoperative wound infection.  I discussed the risk of surgery including bleeding and infection. I also discussed that she may not be happy with the scar in the areola maybe slightly asymmetrical. Current Plans  Pt Education - [facility] Breast Biopsy Instructions (FLB): discussed with patient and provided  information.   Signed by Almond Lint, MD

## 2014-12-10 NOTE — Discharge Instructions (Addendum)
[facility] °Office Phone Number [phone] ° °BREAST BIOPSY/ PARTIAL MASTECTOMY: POST OP INSTRUCTIONS ° °Always review your discharge instruction sheet given to you by the facility where your surgery was performed. ° °IF YOU HAVE DISABILITY OR FAMILY LEAVE FORMS, YOU MUST BRING THEM TO THE OFFICE FOR PROCESSING.  DO NOT GIVE THEM TO YOUR DOCTOR. ° °1. A prescription for pain medication may be given to you upon discharge.  Take your pain medication as prescribed, if needed.  If narcotic pain medicine is not needed, then you may take acetaminophen (Tylenol) or ibuprofen (Advil) as needed. °2. Take your usually prescribed medications unless otherwise directed °3. If you need a refill on your pain medication, please contact your pharmacy.  They will contact our office to request authorization.  Prescriptions will not be filled after 5pm or on week-ends. °4. You should eat very light the first 24 hours after surgery, such as soup, crackers, pudding, etc.  Resume your normal diet the day after surgery. °5. Most patients will experience some swelling and bruising in the breast.  Ice packs and a good support bra will help.  Swelling and bruising can take several days to resolve.  °6. It is common to experience some constipation if taking pain medication after surgery.  Increasing fluid intake and taking a stool softener will usually help or prevent this problem from occurring.  A mild laxative (Milk of Magnesia or Miralax) should be taken according to package directions if there are no bowel movements after 48 hours. °7. Unless discharge instructions indicate otherwise, you may remove your bandages 48 hours after surgery, and you may shower at that time.  You may have steri-strips (small skin tapes) in place directly over the incision.  These strips should be left on the skin for 7-10 days.   Any sutures or staples will be removed at the office during your follow-up visit. °8. ACTIVITIES:  You may resume  regular daily activities (gradually increasing) beginning the next day.  Wearing a good support bra or sports bra (or the breast binder) minimizes pain and swelling.  You may have sexual intercourse when it is comfortable. °a. You may drive when you no longer are taking prescription pain medication, you can comfortably wear a seatbelt, and you can safely maneuver your car and apply brakes. °b. RETURN TO WORK:  __________1 week_______________ °9. You should see your doctor in the office for a follow-up appointment approximately two weeks after your surgery.  Your doctor’s nurse will typically make your follow-up appointment when she calls you with your pathology report.  Expect your pathology report 2-3 business days after your surgery.  You may call to check if you do not hear from us after three days. ° ° °WHEN TO CALL YOUR DOCTOR: °1. Fever over 101.0 °2. Nausea and/or vomiting. °3. Extreme swelling or bruising. °4. Continued bleeding from incision. °5. Increased pain, redness, or drainage from the incision. ° °The clinic staff is available to answer your questions during regular business hours.  Please don’t hesitate to call and ask to speak to one of the nurses for clinical concerns.  If you have a medical emergency, go to the nearest emergency room or call 911.  A surgeon from [facility] is always on call at the hospital. ° °For further questions, please visit [email or web address]  ° ° °Post Anesthesia Home Care Instructions ° °Activity: °Get plenty of rest for the remainder of the day. A responsible adult should stay with you for 24   hours following the procedure.  °For the next 24 hours, DO NOT: °-Drive a car °-Operate machinery °-Drink alcoholic beverages °-Take any medication unless instructed by your physician °-Make any legal decisions or sign important papers. ° °Meals: °Start with liquid foods such as gelatin or soup. Progress to regular foods as tolerated. Avoid greasy, spicy, heavy  foods. If nausea and/or vomiting occur, drink only clear liquids until the nausea and/or vomiting subsides. Call your physician if vomiting continues. ° °Special Instructions/Symptoms: °Your throat may feel dry or sore from the anesthesia or the breathing tube placed in your throat during surgery. If this causes discomfort, gargle with warm salt water. The discomfort should disappear within 24 hours. ° ° °

## 2014-12-10 NOTE — Transfer of Care (Signed)
Immediate Anesthesia Transfer of Care Note  Patient: Sue Williams  Procedure(s) Performed: Procedure(s): CYST EXCISION LEFT BREAST AEREOLA (Left)  Patient Location: PACU  Anesthesia Type:General  Level of Consciousness: awake and patient cooperative  Airway & Oxygen Therapy: Patient Spontanous Breathing and Patient connected to face mask oxygen  Post-op Assessment: Report given to RN and Post -op Vital signs reviewed and stable  Post vital signs: Reviewed and stable  Last Vitals:  Filed Vitals:   12/10/14 0839  BP: 109/75  Pulse: 76  Temp: 36.9 C  Resp: 16    Complications: No apparent anesthesia complications

## 2014-12-10 NOTE — Anesthesia Procedure Notes (Signed)
Procedure Name: LMA Insertion Date/Time: 12/10/2014 9:41 AM Performed by: Genevieve NorlanderLINKA, Vanilla Heatherington L Pre-anesthesia Checklist: Patient identified, Emergency Drugs available, Suction available, Patient being monitored and Timeout performed Patient Re-evaluated:Patient Re-evaluated prior to inductionOxygen Delivery Method: Circle System Utilized Preoxygenation: Pre-oxygenation with 100% oxygen Intubation Type: IV induction Ventilation: Mask ventilation without difficulty LMA: LMA inserted LMA Size: 4.0 Number of attempts: 1 Airway Equipment and Method: Bite block Placement Confirmation: positive ETCO2 Tube secured with: Tape Dental Injury: Teeth and Oropharynx as per pre-operative assessment

## 2014-12-10 NOTE — Anesthesia Preprocedure Evaluation (Addendum)
Anesthesia Evaluation  Patient identified by MRN, date of birth, ID band Patient awake    Reviewed: Allergy & Precautions, NPO status , Patient's Chart, lab work & pertinent test results  Airway Mallampati: I  TM Distance: >3 FB Neck ROM: Full    Dental  (+) Teeth Intact, Dental Advisory Given   Pulmonary Current Smoker,  breath sounds clear to auscultation        Cardiovascular negative cardio ROS  Rhythm:Regular Rate:Normal     Neuro/Psych negative neurological ROS     GI/Hepatic negative GI ROS, Neg liver ROS,   Endo/Other  negative endocrine ROS  Renal/GU negative Renal ROS     Musculoskeletal   Abdominal   Peds  Hematology negative hematology ROS (+)   Anesthesia Other Findings   Reproductive/Obstetrics                            Anesthesia Physical Anesthesia Plan  ASA: I  Anesthesia Plan: General   Post-op Pain Management:    Induction: Intravenous  Airway Management Planned: LMA  Additional Equipment:   Intra-op Plan:   Post-operative Plan:   Informed Consent: I have reviewed the patients History and Physical, chart, labs and discussed the procedure including the risks, benefits and alternatives for the proposed anesthesia with the patient or authorized representative who has indicated his/her understanding and acceptance.   Dental advisory given  Plan Discussed with: CRNA  Anesthesia Plan Comments:         Anesthesia Quick Evaluation

## 2014-12-10 NOTE — Op Note (Signed)
Excisional Breast Biopsy, presumed sebaceous cyst, 1 cm  Indications: This patient presents with history of left breast mass, clinical sebaceous cyst, non-healing.    Pre-operative Diagnosis: left breast mass  Post-operative Diagnosis: left breast mass  Surgeon: Almond LintBYERLY,Jasnoor Trussell   Anesthesia: General LMA anesthesia and Local anesthesia 1% plain lidocaine, 0.25.% bupivacaine, with epinephrine  ASA Class: 1  Procedure Details  The patient was seen in the Holding Room. The risks, benefits, complications, treatment options, and expected outcomes were discussed with the patient. The possibilities of reaction to medication, pulmonary aspiration, bleeding, infection, the need for additional procedures, failure to diagnose a condition, and creating a complication requiring transfusion or operation were discussed with the patient. The patient concurred with the proposed plan, giving informed consent.  The site of surgery properly noted/marked. The patient was taken to Operating Room # 8, identified, and the procedure verified as Breast Excisional Biopsy. A Time Out was held and the above information confirmed.  After induction of anesthesia, the left  breast and chest were prepped and draped in standard fashion. The local anesthetic was administered.  A crescent shape incision was made on the areola, incorporating the skin where the opening of the wound is.   Dissection was carried down around the mass widely so as not to enter the mass.  Hemostasis was achieved with cautery.  The wound was irrigated and closed with a 3-0 Vicryl interrupted deep dermal stitch and a 4-0 Monocryl subcuticular closure in layers.    Sterile dressings were applied. At the end of the operation, all sponge, instrument, and needle counts were correct.  Findings: grossly clear surgical margins  Estimated Blood Loss:  Minimal             Specimens: left breast mass         Complications:  None; patient tolerated the procedure  well.         Disposition: PACU - hemodynamically stable.         Condition: stable

## 2014-12-11 ENCOUNTER — Encounter (HOSPITAL_BASED_OUTPATIENT_CLINIC_OR_DEPARTMENT_OTHER): Payer: Self-pay | Admitting: General Surgery

## 2015-01-11 ENCOUNTER — Telehealth: Payer: Self-pay | Admitting: Nurse Practitioner

## 2015-01-11 NOTE — Telephone Encounter (Signed)
Left message regarding upcoming appointment has been canceled and needs to be rescheduled. °

## 2015-10-07 ENCOUNTER — Encounter: Payer: Self-pay | Admitting: Certified Nurse Midwife

## 2015-10-07 ENCOUNTER — Ambulatory Visit (INDEPENDENT_AMBULATORY_CARE_PROVIDER_SITE_OTHER): Payer: Commercial Managed Care - PPO | Admitting: Certified Nurse Midwife

## 2015-10-07 ENCOUNTER — Ambulatory Visit: Payer: Commercial Managed Care - PPO | Admitting: Nurse Practitioner

## 2015-10-07 VITALS — BP 90/62 | HR 68 | Resp 16 | Ht 66.75 in | Wt 148.0 lb

## 2015-10-07 DIAGNOSIS — Z01419 Encounter for gynecological examination (general) (routine) without abnormal findings: Secondary | ICD-10-CM | POA: Diagnosis not present

## 2015-10-07 DIAGNOSIS — Z0000 Encounter for general adult medical examination without abnormal findings: Secondary | ICD-10-CM | POA: Diagnosis not present

## 2015-10-07 DIAGNOSIS — Z3041 Encounter for surveillance of contraceptive pills: Secondary | ICD-10-CM | POA: Diagnosis not present

## 2015-10-07 DIAGNOSIS — Z124 Encounter for screening for malignant neoplasm of cervix: Secondary | ICD-10-CM | POA: Diagnosis not present

## 2015-10-07 MED ORDER — NORETHINDRONE 0.35 MG PO TABS: 1.0000 | ORAL_TABLET | Freq: Every day | ORAL | Status: DC

## 2015-10-07 NOTE — Progress Notes (Signed)
35 y.o. [id number] Single  Caucasian Fe here for annual exam. Periods normal, no issues. Contraception working well. May plan for pregnancy in this year. Sees Urgent care if needed. Working on decreasing smoking. No other health issues today.   Patient's last menstrual period was 10/07/2015.          Sexually active: Yes.    The current method of family planning is OCP (estrogen/progesterone).    Exercising: Yes.    cardio Smoker:  yes  Health Maintenance: Pap:  08-15-13 neg HPV HR neg MMG:  none Colonoscopy:  none BMD:   none TDaP:  current Shingles: no Pneumonia: no Hep C and HIV: not done Labs: none Self breast exam: done monthly   reports that she has been smoking Cigarettes.  She has been smoking about 1.00 pack per day. She has never used smokeless tobacco. She reports that she drinks alcohol. She reports that she does not use illicit drugs.  Past Medical History  Diagnosis Date  . Sebaceous cyst of skin of left breast     around areola    Past Surgical History  Procedure Laterality Date  . Nasal sinus surgery  2008  . Wisdom tooth extraction    . Tonsillectomy    . Breast cyst excision Left 12/10/2014    Procedure: CYST EXCISION LEFT BREAST AEREOLA;  Surgeon: Almond Lint, MD;  Location: [facility];  Service: General;  Laterality: Left;    Current Outpatient Prescriptions  Medication Sig Dispense Refill  . Multiple Vitamin (MULTIVITAMIN WITH MINERALS) TABS tablet Take 1 tablet by mouth daily.    . norethindrone (MICRONOR,CAMILA,ERRIN) 0.35 MG tablet Take 1 tablet (0.35 mg total) by mouth daily. 3 Package 3   No current facility-administered medications for this visit.    Family History  Problem Relation Age of Onset  . Heart disease Father   . Diabetes Maternal Grandmother   . Heart disease Maternal Grandfather     ROS:  Pertinent items are noted in HPI.  Otherwise, a comprehensive ROS was negative.  Exam:   BP 90/62 mmHg  Pulse 68  Resp 16   Ht 5' 6.75" (1.695 m)  Wt 148 lb (67.132 kg)  BMI 23.37 kg/m2  LMP 10/07/2015 Height: 5' 6.75" (169.5 cm) Ht Readings from Last 3 Encounters:  10/07/15 5' 6.75" (1.695 m)  12/10/14 5\' 7"  (1.702 m)  10/01/14 5\' 7"  (1.702 m)    General appearance: alert, cooperative and appears stated age Head: Normocephalic, without obvious abnormality, atraumatic Neck: no adenopathy, supple, symmetrical, trachea midline and thyroid normal to inspection and palpation Lungs: clear to auscultation bilaterally Breasts: normal appearance, no masses or tenderness, No nipple retraction or dimpling, No nipple discharge or bleeding, No axillary or supraclavicular adenopathy Heart: regular rate and rhythm Abdomen: soft, non-tender; no masses,  no organomegaly Extremities: extremities normal, atraumatic, no cyanosis or edema Skin: Skin color, texture, turgor normal. No rashes or lesions Lymph nodes: Cervical, supraclavicular, and axillary nodes normal. No abnormal inguinal nodes palpated Neurologic: Grossly normal   Pelvic: External genitalia:  no lesions              Urethra:  normal appearing urethra with no masses, tenderness or lesions              Bartholin's and Skene's: normal                 Vagina: normal appearing vagina with normal color and discharge, no lesions  Cervix: normal,no lesions or tenderness              Pap taken: Yes.   Bimanual Exam:  Uterus:  normal size, contour, position, consistency, mobility, non-tender              Adnexa: normal adnexa and no mass, fullness, tenderness               Rectovaginal: Confirms               Anus:  normal sphincter tone, no lesions  Chaperone present: yes  A:  Well Woman with normal exam  Contraception POP desired  Screening labs  May try for pregnancy later this year  Smoker, working on cessation  P:   Reviewed health and wellness pertinent to exam  Rx Micronor see order  Lab: HIV,Hep C, Rubella  Pap smear as above with HPV  reflex  Discussed scheduling preconceptual visit prior to stopping POP and starting on prenatal vitamins 3 months prior to trying. Questions addressed.   counseled on breast self exam, STD prevention, HIV risk factors and prevention, use and side effects of OCP's, adequate intake of calcium and vitamin D, diet and exercise  return annually or prn  An After Visit Summary was printed and given to the patient.

## 2015-10-07 NOTE — Patient Instructions (Signed)

## 2015-10-08 LAB — IPS PAP TEST WITH REFLEX TO HPV

## 2015-10-08 LAB — HIV ANTIBODY (ROUTINE TESTING W REFLEX): HIV: NONREACTIVE

## 2015-10-08 LAB — RUBELLA SCREEN: Rubella: 1.36 Index — ABNORMAL HIGH (ref <0.90)

## 2015-10-08 LAB — HEPATITIS C ANTIBODY: HCV AB: NEGATIVE

## 2015-10-10 NOTE — Progress Notes (Signed)
Reviewed personally.  M. Suzanne Lisa Blakeman, MD.  

## 2016-10-12 ENCOUNTER — Ambulatory Visit: Payer: Commercial Managed Care - PPO | Admitting: Certified Nurse Midwife

## 2016-10-19 ENCOUNTER — Encounter: Payer: Self-pay | Admitting: Certified Nurse Midwife

## 2016-10-19 ENCOUNTER — Ambulatory Visit (INDEPENDENT_AMBULATORY_CARE_PROVIDER_SITE_OTHER): Payer: Commercial Managed Care - PPO | Admitting: Certified Nurse Midwife

## 2016-10-19 VITALS — BP 94/60 | HR 60 | Resp 16 | Ht 66.5 in | Wt 150.0 lb

## 2016-10-19 DIAGNOSIS — Z3041 Encounter for surveillance of contraceptive pills: Secondary | ICD-10-CM

## 2016-10-19 DIAGNOSIS — Z0000 Encounter for general adult medical examination without abnormal findings: Secondary | ICD-10-CM | POA: Diagnosis not present

## 2016-10-19 DIAGNOSIS — Z01419 Encounter for gynecological examination (general) (routine) without abnormal findings: Secondary | ICD-10-CM

## 2016-10-19 NOTE — Progress Notes (Signed)
36 y.o. [id number] Single  Caucasian Fe here for annual exam.  Periods normal, no issues. Contraception POP working well, desires continuance at least for a few more months. Planning pregnancy soon. Switch[place] employment [place] now. Saw urgent care for URI in 2017, otherwise no health issues. Desires screening labs if needed.  Patient's last menstrual period was 09/29/2016 (exact date).          Sexually active: Yes.    The current method of family planning is OCP (estrogen/progesterone).    Exercising: Yes.    running & walking Smoker:  yes  Health Maintenance: Pap:  10-07-15 neg MMG:  none Colonoscopy:  none BMD:   none TDaP:  2018 Shingles: no Pneumonia: no Hep C and HIV: both neg 2017 Labs: declined Self breast exam: done monthly   reports that she has been smoking Cigarettes.  She has never used smokeless tobacco. She reports that she does not drink alcohol or use drugs.  Past Medical History:  Diagnosis Date  . Sebaceous cyst of skin of left breast    around areola    Past Surgical History:  Procedure Laterality Date  . BREAST CYST EXCISION Left 12/10/2014   Procedure: CYST EXCISION LEFT BREAST AEREOLA;  Surgeon: Almond LintFaera Byerly, MD;  Location: [facility];  Service: General;  Laterality: Left;  . NASAL SINUS SURGERY  2008  . TONSILLECTOMY    . WISDOM TOOTH EXTRACTION      Current Outpatient Prescriptions  Medication Sig Dispense Refill  . Multiple Vitamin (MULTIVITAMIN WITH MINERALS) TABS tablet Take 1 tablet by mouth daily.    . norethindrone (MICRONOR,CAMILA,ERRIN) 0.35 MG tablet Take 1 tablet (0.35 mg total) by mouth daily. 3 Package 4   No current facility-administered medications for this visit.     Family History  Problem Relation Age of Onset  . Heart disease Father   . Diabetes Maternal Grandmother   . Heart disease Maternal Grandfather     ROS:  Pertinent items are noted in HPI.  Otherwise, a comprehensive ROS was  negative.  Exam:   BP 94/60   Pulse 60   Resp 16   Ht 5' 6.5" (1.689 m)   Wt 150 lb (68 kg)   LMP 09/29/2016 (Exact Date)   BMI 23.85 kg/m  Height: 5' 6.5" (168.9 cm) Ht Readings from Last 3 Encounters:  10/19/16 5' 6.5" (1.689 m)  10/07/15 5' 6.75" (1.695 m)  12/10/14 5\' 7"  (1.702 m)    General appearance: alert, cooperative and appears stated age Head: Normocephalic, without obvious abnormality, atraumatic Neck: no adenopathy, supple, symmetrical, trachea midline and thyroid normal to inspection and palpation Lungs: clear to auscultation bilaterally Breasts: normal appearance, no masses or tenderness, No nipple retraction or dimpling, No nipple discharge or bleeding, No axillary or supraclavicular adenopathy Heart: regular rate and rhythm Abdomen: soft, non-tender; no masses,  no organomegaly Extremities: extremities normal, atraumatic, no cyanosis or edema Skin: Skin color, texture, turgor normal. No rashes or lesions Lymph nodes: Cervical, supraclavicular, and axillary nodes normal. No abnormal inguinal nodes palpated Neurologic: Grossly normal   Pelvic: External genitalia:  no lesions              Urethra:  normal appearing urethra with no masses, tenderness or lesions              Bartholin's and Skene's: normal                 Vagina: normal appearing  vagina with normal color and discharge, no lesions              Cervix: no cervical motion tenderness, no lesions and nulliparous appearance              Pap taken: No. Bimanual Exam:  Uterus:  normal size, contour, position, consistency, mobility, non-tender              Adnexa: normal adnexa and no mass, fullness, tenderness               Rectovaginal: Confirms               Anus:  normal sphincter tone, no lesions  Chaperone present: yes  A:  Well Woman with normal exam  Contraception POP due to smoking, working on quitting now for pregnancy  Planning pregnancy this year  Screening labs  P:   Reviewed health  and wellness pertinent to exam  Discussed risks and benefits of POP, patient declined Rx, has 3 packs, will call if needed.  Discussed importance of starting on prenatal vitamins now and smoking cessation. Discussed Rubella screen for immune status, due to effects in early gestational development. Questions addressed. Encouraged pre conception consultation with partner.  Lab: Rubella,   Screening Labs: HIV,Hep. C, TSH  Pap smear as above not taken   counseled on breast self exam, adequate intake of calcium and vitamin D, diet and exercise  return annually or prn  An After Visit Summary was printed and given to the patient.

## 2016-10-19 NOTE — Patient Instructions (Signed)

## 2016-10-20 LAB — HEPATITIS C ANTIBODY: HCV Ab: NEGATIVE

## 2016-10-20 LAB — RUBELLA SCREEN: Rubella: 1.38 {index} — ABNORMAL HIGH

## 2016-10-20 LAB — TSH: TSH: 1.06 m[IU]/L

## 2016-10-20 LAB — HIV ANTIBODY (ROUTINE TESTING W REFLEX): HIV: NONREACTIVE

## 2016-10-24 NOTE — Progress Notes (Signed)
Reviewed personally.  M. Suzanne Jessey Huyett, MD.  

## 2017-06-28 ENCOUNTER — Ambulatory Visit (INDEPENDENT_AMBULATORY_CARE_PROVIDER_SITE_OTHER): Payer: 59 | Admitting: Certified Nurse Midwife

## 2017-06-28 ENCOUNTER — Encounter: Payer: Self-pay | Admitting: Certified Nurse Midwife

## 2017-06-28 VITALS — BP 100/60 | HR 68 | Resp 16 | Ht 66.5 in | Wt 148.0 lb

## 2017-06-28 DIAGNOSIS — N912 Amenorrhea, unspecified: Secondary | ICD-10-CM

## 2017-06-28 DIAGNOSIS — Z3201 Encounter for pregnancy test, result positive: Secondary | ICD-10-CM

## 2017-06-28 NOTE — Progress Notes (Signed)
36 y.o. female single  [id number]  presents with amenorrhea with + UPT on 06/24/17. LMP 05/16/17. Unplanned pregnancy. Partner is 46, both thought they were to old to get pregnant. Complaining of breast tenderness, fatigue, slight nausea. Denies spotting, bleeding or cramping. Medications she is taking are: multivitamin, probiotic and Biotin. Patient has not consumed alcohol since + UPT. Previous smoker, not smoking now. Partner supportive with whatever decision she decides to make. Has had 2 TAB and just "does not know how she feels". Buying new house and not "sure baby fits into these plans and I am older". Eating well and sleeping "OK".  Review of Systems  Constitutional: Positive for malaise/fatigue.  Respiratory: Negative.   Cardiovascular: Negative.   Gastrointestinal: Negative.   Genitourinary: Negative.   Skin: Negative.   Neurological: Negative.   Psychiatric/Behavioral: The patient is nervous/anxious.        Regarding decision to continue pregnancy or not. No crying or panic attacks.    O: HPI pertinent to above. Healthy WDWN female Affect: normal, orientation x 3  Last Aex:10/19/16 Pap smear:10/07/15 normal             Rubella screen:10/19/16 positive immunity  A: Amenorrhea with positive UPT  6 wk 1 days per [facility] with [facility] 02/20/18 Unplanned pregnancy   P: Reviewed with patient importance of prenatal care during pregnancy. Given OB provider list. Reviewed nutrition importance of pregnancy and selecting from all food groups and making sure to have adequate protein intake daily. Discussed avoiding raw or exotic fish, soft cheeses due to risk of bacteria . Discussed concerns with FAS with alcohol use in pregnancy. Discussed increase of IUGR and SIDS with smoking use or second smoke. Reviewed warning signs of early pregnancy and need to advise if occurs. Discussed comfort measures for early pregnancy changes. Offered viability PUS here prior to initiating prenatal care. Patient will advise  if/ plans to have PUS. She will be called with insurance information and scheduled. Questions addressed at length. Encouraged patient to have serious discussion with partner about how they both feel about pregnancy and due to late age, maybe there only opportunity to have a child. Reminded she had Rubella screen at aex in 1/18 because she was hopeful for child in this relationship. Patient will advise of plans and will move forward as above if needed. Discussed if plans to not continue pregnancy to please advise. Patient agreeable.  Labs:none  Rv prn   35 minutes time spent with patient in face to face counseling regarding pregnancy and prenatal care

## 2017-06-28 NOTE — Patient Instructions (Signed)

## 2017-06-29 LAB — POCT URINE PREGNANCY: PREG TEST UR: POSITIVE — AB

## 2017-07-09 ENCOUNTER — Telehealth: Payer: Self-pay | Admitting: Certified Nurse Midwife

## 2017-07-09 NOTE — Telephone Encounter (Signed)
Patient called to let Leota Sauers, CNM know she "does not want to move forward." Patient declined to give any other details stating the medical provider will know what she means.

## 2017-07-09 NOTE — Telephone Encounter (Signed)
Left detailed message, ok per current dpr. Advised patient will forward message to Leota Sauers, CNM, calling to notify that she is out of the office today. Advised to return call to office at [phone] for any additional questions.  Routing to [employer], CNM FYI.

## 2017-07-17 NOTE — Telephone Encounter (Signed)
Routing to provider for review. Okay to close encounter or is further follow up needed? °

## 2017-07-25 ENCOUNTER — Telehealth: Payer: Self-pay | Admitting: Certified Nurse Midwife

## 2017-07-25 NOTE — Telephone Encounter (Signed)
Spoke with patient. Patient states that she had an abortion on 06/28/2017. Was given a one month supply of Lo Loestrin Fe to start taking. Has taken 10 days. Asking for a refill on OCP until her next aex. Per review of records patient was previously on Micronor as she is a smoker.   Leota Sauerseborah Leonard CNM please advise on how to proceed? Obtain records? Need lab testing?

## 2017-07-25 NOTE — Telephone Encounter (Signed)
Patient would like to discuss going back on birth control pills.

## 2017-07-25 NOTE — Telephone Encounter (Signed)
Need records of procedure and negative pregnancy test. She does not need OCP. She should be on POP.

## 2017-07-25 NOTE — Telephone Encounter (Signed)
Spoke with patient. Patient will call for release of records to our office. Advised these will need to be reviewed by Leota Sauerseborah Leonard CNM once received for refill of POP.

## 2017-08-06 NOTE — Telephone Encounter (Signed)
Patient called to check on the status of the call. This is the last week of pills she has.

## 2017-08-06 NOTE — Telephone Encounter (Signed)
Left message to call Lilianne Delair at [phone].  Did patient have records faxed to office for Leota Sauerseborah Leonard CNM to review?

## 2017-08-08 MED ORDER — NORETHINDRONE 0.35 MG PO TABS: 1.0000 | ORAL_TABLET | Freq: Every day | ORAL | 0 refills | Status: DC

## 2017-08-08 NOTE — Telephone Encounter (Signed)
Ok to refill POP, her aex said she was smoking, her confirmation of pregnancy says she had stopped. Reviewed records with confirmation of given medication for TAB.

## 2017-08-08 NOTE — Telephone Encounter (Signed)
Spoke with Edgardo RoysGreta who states records were received. Routing to [employer] Leonard CNM for review and advise.

## 2017-08-08 NOTE — Telephone Encounter (Signed)
Reviewed records and confirms given medication for TAB. Ok to refill POP only due to smoking

## 2017-08-08 NOTE — Telephone Encounter (Signed)
Spoke with patient. Patient states that she is still smoking occasionally. Advised will need to restart on POP as it is unsafe to be on OCP while smoking. Patient verbalizes understanding. Rx for Micronor #3 0RF sent to pharmacy on file to get patient to her next aex with Leota Sauerseborah Leonard CNM. Patient is agreeable.  Routing to provider for final review.Unable to close due to open note from Leota Sauerseborah Leonard CNM.

## 2017-08-08 NOTE — Telephone Encounter (Signed)
Left message to call Suanne Minahan at [phone]. 

## 2017-10-11 NOTE — Telephone Encounter (Signed)
Routing to provider for review.

## 2017-10-12 NOTE — Telephone Encounter (Signed)
Ok to close. Update of status in epic

## 2017-10-18 ENCOUNTER — Other Ambulatory Visit: Payer: Self-pay | Admitting: Certified Nurse Midwife

## 2017-10-18 NOTE — Telephone Encounter (Signed)
Medication refill request: POP Last AEX:  10/19/16 DL  Next AEX: 4/0/982/5/19  Last MMG (if hormonal medication request): n/a  Refill authorized: 08/08/17 #3 package, 0 RF. Today, please advise.

## 2017-10-23 ENCOUNTER — Ambulatory Visit: Payer: Commercial Managed Care - PPO | Admitting: Certified Nurse Midwife

## 2017-10-30 ENCOUNTER — Encounter: Payer: Self-pay | Admitting: Certified Nurse Midwife

## 2017-10-30 ENCOUNTER — Other Ambulatory Visit: Payer: Self-pay

## 2017-10-30 ENCOUNTER — Ambulatory Visit (INDEPENDENT_AMBULATORY_CARE_PROVIDER_SITE_OTHER): Payer: 59 | Admitting: Certified Nurse Midwife

## 2017-10-30 VITALS — BP 104/64 | HR 68 | Resp 16 | Ht 66.5 in | Wt 147.0 lb

## 2017-10-30 DIAGNOSIS — Z01419 Encounter for gynecological examination (general) (routine) without abnormal findings: Secondary | ICD-10-CM

## 2017-10-30 DIAGNOSIS — Z3041 Encounter for surveillance of contraceptive pills: Secondary | ICD-10-CM | POA: Diagnosis not present

## 2017-10-30 MED ORDER — NORETHINDRONE 0.35 MG PO TABS: ORAL_TABLET | ORAL | 12 refills | Status: DC

## 2017-10-30 NOTE — Progress Notes (Signed)
37 y.o. [id number] Single  Caucasian Fe here for annual exam. Periods normal, no issues. Had TAB and now has resumed normal cycles. Emotionally doing OK and partner supportive. Exercises through out the week. Contraception POP working well. Sees Urgent care if needed. No other health issues today. [place] [facility] [place] [facility] [place]  Patient's last menstrual period was 10/21/2017.          Sexually active: Yes.    The current method of family planning is oral progesterone-only contraceptive.    Exercising: Yes.    variety at the gym Smoker:  no  Health Maintenance: Pap:  10-07-15 neg. History of Abnormal Pap: no MMG:  none Self Breast exams: yes Colonoscopy:  none BMD:   none TDaP:  2018 Shingles: no Pneumonia: no Hep C and HIV: both neg 2018 Labs: if needed   reports that she has been smoking cigarettes.  she has never used smokeless tobacco. She reports that she drinks alcohol. She reports that she does not use drugs.  Past Medical History:  Diagnosis Date  . Sebaceous cyst of skin of left breast    around areola    Past Surgical History:  Procedure Laterality Date  . BREAST CYST EXCISION Left 12/10/2014   Procedure: CYST EXCISION LEFT BREAST AEREOLA;  Surgeon: Almond LintFaera Byerly, MD;  Location: [facility];  Service: General;  Laterality: Left;  . NASAL SINUS SURGERY  2008  . TONSILLECTOMY    . WISDOM TOOTH EXTRACTION      Current Outpatient Medications  Medication Sig Dispense Refill  . Biotin w/ Vitamins C & E (HAIR/SKIN/NAILS PO) Take by mouth.    . Multiple Vitamins-Minerals (MULTIVITAMIN PO) Take by mouth.    . NORLYDA 0.35 MG tablet TAKE 1 TABLET(0.35 MG) BY MOUTH DAILY 84 tablet 0  . Probiotic Product (PROBIOTIC PO) Take by mouth.     No current facility-administered medications for this visit.     Family History  Problem Relation Age of Onset  . Heart disease Father   . Diabetes Maternal Grandmother   . Heart disease Maternal  Grandfather     ROS:  Pertinent items are noted in HPI.  Otherwise, a comprehensive ROS was negative.  Exam:   BP 104/64   Pulse 68   Resp 16   Ht 5' 6.5" (1.689 m)   Wt 147 lb (66.7 kg)   LMP 10/21/2017   Breastfeeding? Unknown   BMI 23.37 kg/m  Height: 5' 6.5" (168.9 cm) Ht Readings from Last 3 Encounters:  10/30/17 5' 6.5" (1.689 m)  06/28/17 5' 6.5" (1.689 m)  10/19/16 5' 6.5" (1.689 m)    General appearance: alert, cooperative and appears stated age Head: Normocephalic, without obvious abnormality, atraumatic Neck: no adenopathy, supple, symmetrical, trachea midline and thyroid normal to inspection and palpation Lungs: clear to auscultation bilaterally Breasts: normal appearance, no masses or tenderness, No nipple retraction or dimpling, No nipple discharge or bleeding, No axillary or supraclavicular adenopathy Heart: regular rate and rhythm Abdomen: soft, non-tender; no masses,  no organomegaly Extremities: extremities normal, atraumatic, no cyanosis or edema Skin: Skin color, texture, turgor normal. No rashes or lesions Lymph nodes: Cervical, supraclavicular, and axillary nodes normal. No abnormal inguinal nodes palpated Neurologic: Grossly normal   Pelvic: External genitalia:  no lesions              Urethra:  normal appearing urethra with no masses, tenderness or lesions  Bartholin's and Skene's: normal                 Vagina: normal appearing vagina with normal color and discharge, no lesions              Cervix: no cervical motion tenderness, no lesions and normal appearance              Pap taken: No. Bimanual Exam:  Uterus:  normal size, contour, position, consistency, mobility, non-tender and anteverted              Adnexa: normal adnexa and no mass, fullness, tenderness               Rectovaginal: Confirms               Anus:  normal sphincter tone, no lesions  Chaperone present: yes  A:  Well Woman with normal exam  Contraception POP  desired  Smoker occasional 2 x weekly  Emotionally well since TAB    P:   Reviewed health and wellness pertinent to exam  Risks/benefits/warning signs of POP given and need to advise if occurs. Desires continuance  Encouraged to stop smoking all together. Aware of cessation program.   Rx Norlynda see order with instructions  Screening labs next year  Pap smear: no   counseled on breast self exam, STD prevention, HIV risk factors and prevention, use and side effects of OCP's, adequate intake of calcium and vitamin D, diet and exercise  return annually or prn  An After Visit Summary was printed and given to the patient.

## 2017-10-30 NOTE — Patient Instructions (Signed)

## 2018-06-24 ENCOUNTER — Ambulatory Visit (INDEPENDENT_AMBULATORY_CARE_PROVIDER_SITE_OTHER): Payer: Self-pay | Admitting: Adult Health

## 2018-06-24 VITALS — BP 105/72 | HR 96 | Temp 98.9°F | Resp 16 | Wt 152.4 lb

## 2018-06-24 DIAGNOSIS — H66002 Acute suppurative otitis media without spontaneous rupture of ear drum, left ear: Secondary | ICD-10-CM

## 2018-06-24 DIAGNOSIS — J029 Acute pharyngitis, unspecified: Secondary | ICD-10-CM

## 2018-06-24 MED ORDER — AMOXICILLIN-POT CLAVULANATE 875-125 MG PO TABS: 1.0000 | ORAL_TABLET | Freq: Two times a day (BID) | ORAL | 0 refills | Status: DC

## 2018-06-24 NOTE — Patient Instructions (Signed)
Amoxicillin; Clavulanic Acid tablets What is this medicine? AMOXICILLIN; CLAVULANIC ACID (a mox i SIL in; KLAV yoo lan ic AS id) is a penicillin antibiotic. It is used to treat certain kinds of bacterial infections. It will not work for colds, flu, or other viral infections. This medicine may be used for other purposes; ask your health care provider or pharmacist if you have questions. COMMON BRAND NAME(S): Augmentin What should I tell my health care provider before I take this medicine? They need to know if you have any of these conditions: -bowel disease, like colitis -kidney disease -liver disease -mononucleosis -an unusual or allergic reaction to amoxicillin, penicillin, cephalosporin, other antibiotics, clavulanic acid, other medicines, foods, dyes, or preservatives -pregnant or trying to get pregnant -breast-feeding How should I use this medicine? Take this medicine by mouth with a full glass of water. Follow the directions on the prescription label. Take at the start of a meal. Do not crush or chew. If the tablet has a score line, you may cut it in half at the score line for easier swallowing. Take your medicine at regular intervals. Do not take your medicine more often than directed. Take all of your medicine as directed even if you think you are better. Do not skip doses or stop your medicine early. Talk to your pediatrician regarding the use of this medicine in children. Special care may be needed. Overdosage: If you think you have taken too much of this medicine contact a poison control center or emergency room at once. NOTE: This medicine is only for you. Do not share this medicine with others. What if I miss a dose? If you miss a dose, take it as soon as you can. If it is almost time for your next dose, take only that dose. Do not take double or extra doses. What may interact with this medicine? -allopurinol -anticoagulants -birth control pills -methotrexate -probenecid This  list may not describe all possible interactions. Give your health care provider a list of all the medicines, herbs, non-prescription drugs, or dietary supplements you use. Also tell them if you smoke, drink alcohol, or use illegal drugs. Some items may interact with your medicine. What should I watch for while using this medicine? Tell your doctor or health care professional if your symptoms do not improve. Do not treat diarrhea with over the counter products. Contact your doctor if you have diarrhea that lasts more than 2 days or if it is severe and watery. If you have diabetes, you may get a false-positive result for sugar in your urine. Check with your doctor or health care professional. Birth control pills may not work properly while you are taking this medicine. Talk to your doctor about using an extra method of birth control. What side effects may I notice from receiving this medicine? Side effects that you should report to your doctor or health care professional as soon as possible: -allergic reactions like skin rash, itching or hives, swelling of the face, lips, or tongue -breathing problems -dark urine -fever or chills, sore throat -redness, blistering, peeling or loosening of the skin, including inside the mouth -seizures -trouble passing urine or change in the amount of urine -unusual bleeding, bruising -unusually weak or tired -white patches or sores in the mouth or throat Side effects that usually do not require medical attention (report to your doctor or health care professional if they continue or are bothersome): -diarrhea -dizziness -headache -nausea, vomiting -stomach upset -vaginal or anal irritation This list may   not describe all possible side effects. Call your doctor for medical advice about side effects. You may report side effects to FDA at 1-800-FDA-1088. Where should I keep my medicine? Keep out of the reach of children. Store at room temperature below 25 degrees  C (77 degrees F). Keep container tightly closed. Throw away any unused medicine after the expiration date. NOTE: This sheet is a summary. It may not cover all possible information. If you have questions about this medicine, talk to your doctor, pharmacist, or health care provider.  2018 Elsevier/Gold Standard (2007-12-05 12:04:30) Otitis Media, Adult Otitis media is redness, soreness, and puffiness (swelling) in the space just behind your eardrum (middle ear). It may be caused by allergies or infection. It often happens along with a cold. Follow these instructions at home:  Take your medicine as told. Finish it even if you start to feel better.  Only take over-the-counter or prescription medicines for pain, discomfort, or fever as told by your doctor.  Follow up with your doctor as told. Contact a doctor if:  You have otitis media only in one ear, or bleeding from your nose, or both.  You notice a lump on your neck.  You are not getting better in 3-5 days.  You feel worse instead of better. Get help right away if:  You have pain that is not helped with medicine.  You have puffiness, redness, or pain around your ear.  You get a stiff neck.  You cannot move part of your face (paralysis).  You notice that the bone behind your ear hurts when you touch it. This information is not intended to replace advice given to you by your health care provider. Make sure you discuss any questions you have with your health care provider. Document Released: 02/28/2008 Document Revised: 02/17/2016 Document Reviewed: 04/08/2013 Elsevier Interactive Patient Education  2017 Elsevier Inc. Pharyngitis Pharyngitis is a sore throat (pharynx). There is redness, pain, and swelling of your throat. Follow these instructions at home:  Drink enough fluids to keep your pee (urine) clear or pale yellow.  Only take medicine as told by your doctor. ? You may get sick again if you do not take medicine as told.  Finish your medicines, even if you start to feel better. ? Do not take aspirin.  Rest.  Rinse your mouth (gargle) with salt water ( tsp of salt per 1 qt of water) every 1-2 hours. This will help the pain.  If you are not at risk for choking, you can suck on hard candy or sore throat lozenges. Contact a doctor if:  You have large, tender lumps on your neck.  You have a rash.  You cough up green, yellow-brown, or bloody spit. Get help right away if:  You have a stiff neck.  You drool or cannot swallow liquids.  You throw up (vomit) or are not able to keep medicine or liquids down.  You have very bad pain that does not go away with medicine.  You have problems breathing (not from a stuffy nose). This information is not intended to replace advice given to you by your health care provider. Make sure you discuss any questions you have with your health care provider. Document Released: 02/28/2008 Document Revised: 02/17/2016 Document Reviewed: 05/19/2013 Elsevier Interactive Patient Education  2017 Elsevier Inc.  

## 2018-06-24 NOTE — Progress Notes (Signed)
Subjective:     Patient ID: Sue Williams, female   DOB: April 10, 1981, 37 y.o.   MRN: [id number]   Blood pressure 105/72, pulse 96, temperature 98.9 F (37.2 C), temperature source Oral, resp. rate 16, weight 152 lb 6.4 oz (69.1 kg), SpO2 98 %, unknown if currently breastfeeding. Sore Throat   This is a new problem. The current episode started in the past 7 days (two days ). The problem has been gradually worsening. The pain is worse on the right side. There has been no fever. The pain is at a severity of 0/10. The pain is moderate. Associated symptoms include ear pain and headaches (mild since sorethroat intermittent ). Pertinent negatives include no abdominal pain, congestion, coughing, diarrhea, drooling, ear discharge, hoarse voice, plugged ear sensation, neck pain, shortness of breath, stridor, swollen glands, trouble swallowing or vomiting. She has had no exposure to strep or mono. She has tried cool liquids for the symptoms. The treatment provided no relief.     Patient is a 37 year old female in no acute distress who comes to the clinic for complaint of sore throat x 2 days. Complains of burning sore throat. Painful swallowing. Fatigue. Just returned from traveling on airplane two days ago.   Allergies  Allergen Reactions  . Codeine Nausea And Vomiting  . Demerol [Meperidine] Nausea And Vomiting   LMP 06/19/2018 Denies any chance of pregnancy.   Review of Systems  Constitutional: Positive for fatigue. Negative for activity change, appetite change, chills, diaphoresis, fever and unexpected weight change.  HENT: Positive for ear pain and sore throat. Negative for congestion, dental problem, drooling, ear discharge, facial swelling, hearing loss, hoarse voice, mouth sores, nosebleeds, postnasal drip, rhinorrhea, sinus pressure, sinus pain, sneezing, tinnitus, trouble swallowing and voice change.   Eyes: Negative.   Respiratory: Negative.  Negative for cough, shortness of breath and  stridor.   Cardiovascular: Negative.   Gastrointestinal: Negative.  Negative for abdominal pain, diarrhea and vomiting.  Endocrine: Negative.   Genitourinary: Negative.   Musculoskeletal: Negative.  Negative for neck pain.  Skin: Negative.   Neurological: Positive for facial asymmetry and headaches (mild since sorethroat intermittent ). Negative for dizziness, tremors, seizures, syncope, speech difficulty, weakness, light-headedness and numbness.  Hematological: Negative.   Psychiatric/Behavioral: Negative.        Objective:   Physical Exam  Constitutional: She is oriented to person, place, and time. Vital signs are normal. She appears well-developed and well-nourished. She is active.  Non-toxic appearance. She does not have a sickly appearance. She does not appear ill. No distress.  Patient is alert and oriented and responsive to questions Engages in eye contact with provider. Speaks in full sentences without any pauses without any shortness of breath or distress.   Patient moves on and off of exam table and in room without difficulty. Gait is normal in hall and in room. Patient is oriented to person place time and situation. Patient answers questions appropriately and engages in conversation.   HENT:  Head: Normocephalic and atraumatic.  Right Ear: Hearing, tympanic membrane, external ear and ear canal normal. Tympanic membrane is not perforated and not erythematous. No middle ear effusion.  Left Ear: Hearing, external ear and ear canal normal. Tympanic membrane is erythematous. Tympanic membrane is not perforated. A middle ear effusion is present.  Nose: Nose normal. No mucosal edema or rhinorrhea. Right sinus exhibits no maxillary sinus tenderness and no frontal sinus tenderness. Left sinus exhibits no maxillary sinus tenderness and no frontal sinus  tenderness.  Mouth/Throat: Uvula is midline and mucous membranes are normal. Mucous membranes are not pale, not dry and not cyanotic.  Oropharyngeal exudate (white exudate posterior pharyngeal ) and posterior oropharyngeal erythema present. No posterior oropharyngeal edema or tonsillar abscesses. Tonsils are 0 on the right. Tonsils are 0 on the left. No tonsillar exudate.  Eyes: Pupils are equal, round, and reactive to light. Conjunctivae, EOM and lids are normal. Right eye exhibits no discharge. Left eye exhibits no discharge. No scleral icterus.  Neck: Normal range of motion. Neck supple.  Cardiovascular: Normal rate, regular rhythm, normal heart sounds and intact distal pulses. Exam reveals no gallop and no friction rub.  No murmur heard. Pulmonary/Chest: Effort normal and breath sounds normal.  Abdominal: Soft. Bowel sounds are normal.  Musculoskeletal: Normal range of motion.  Lymphadenopathy:    She has no cervical adenopathy.  Neurological: She is alert and oriented to person, place, and time.  Skin: Skin is warm, dry and intact. Capillary refill takes less than 2 seconds. No rash noted. She is not diaphoretic. No erythema. No pallor.  Psychiatric: She has a normal mood and affect. Her speech is normal and behavior is normal. Judgment and thought content normal. Cognition and memory are normal.  Vitals reviewed.      Assessment:     Pharyngitis, unspecified etiology  Non-recurrent acute suppurative otitis media of left ear without spontaneous rupture of tympanic membrane      Plan:        Reviewed After Visit Summary with patient.   Meds ordered this encounter  Medications  . amoxicillin-clavulanate (AUGMENTIN) 875-125 MG tablet    Sig: Take 1 tablet by mouth 2 (two) times daily.    Dispense:  20 tablet    Refill:  0   Advised patient call the office or your primary care doctor for an appointment if no improvement within 72 hours or if any symptoms change or worsen at any time  Advised ER or urgent Care if after hours or on weekend. Call 911 for emergency symptoms at any time.Patinet verbalized  understanding of all instructions given/reviewed and treatment plan and has no further questions or concerns at this time.    Patient verbalized understanding of all instructions given and denies any further questions at this time.

## 2018-07-10 DIAGNOSIS — H5213 Myopia, bilateral: Secondary | ICD-10-CM | POA: Diagnosis not present

## 2018-10-31 ENCOUNTER — Ambulatory Visit: Payer: 59 | Admitting: Certified Nurse Midwife

## 2018-10-31 NOTE — Progress Notes (Deleted)
38 y.o. G3P0030 Single  {Race/ethnicity:17218} Fe here for annual exam.    No LMP recorded.          Sexually active: {yes no:314532}  The current method of family planning is {contraception:315051}.    Exercising: {yes no:314532}  {types:19826} Smoker:  {YES NO:22349}  ROS  Health Maintenance: Pap:  10-07-15 neg History of Abnormal Pap: {YES NO:22349} MMG:  none Self Breast exams: {YES NO:22349} Colonoscopy:  none BMD:   none TDaP:  2018 Shingles: no Pneumonia: no Hep C and HIV: both neg 2018 Labs: ***   reports that she has been smoking cigarettes. She has never used smokeless tobacco. She reports current alcohol use. She reports that she does not use drugs.  Past Medical History:  Diagnosis Date  . Sebaceous cyst of skin of left breast    around areola    Past Surgical History:  Procedure Laterality Date  . BREAST CYST EXCISION Left 12/10/2014   Procedure: CYST EXCISION LEFT BREAST AEREOLA;  Surgeon: Almond Lint, MD;  Location: [facility];  Service: General;  Laterality: Left;  . NASAL SINUS SURGERY  2008  . TONSILLECTOMY    . WISDOM TOOTH EXTRACTION      Current Outpatient Medications  Medication Sig Dispense Refill  . amoxicillin-clavulanate (AUGMENTIN) 875-125 MG tablet Take 1 tablet by mouth 2 (two) times daily. 20 tablet 0  . Biotin w/ Vitamins C & E (HAIR/SKIN/NAILS PO) Take by mouth.    . Multiple Vitamins-Minerals (MULTIVITAMIN PO) Take by mouth.    . norethindrone (NORLYDA) 0.35 MG tablet TAKE 1 TABLET(0.35 MG) BY MOUTH DAILY 1 tablet 12  . Probiotic Product (PROBIOTIC PO) Take by mouth.     No current facility-administered medications for this visit.     Family History  Problem Relation Age of Onset  . Heart disease Father   . Diabetes Maternal Grandmother   . Heart disease Maternal Grandfather     ROS:  Pertinent items are noted in HPI.  Otherwise, a comprehensive ROS was negative.  Exam:   There were no vitals taken for this  visit.   Ht Readings from Last 3 Encounters:  10/30/17 5' 6.5" (1.689 m)  06/28/17 5' 6.5" (1.689 m)  10/19/16 5' 6.5" (1.689 m)    General appearance: alert, cooperative and appears stated age Head: Normocephalic, without obvious abnormality, atraumatic Neck: no adenopathy, supple, symmetrical, trachea midline and thyroid {EXAM; THYROID:18604} Lungs: clear to auscultation bilaterally Breasts: {Exam; breast:13139::"normal appearance, no masses or tenderness"} Heart: regular rate and rhythm Abdomen: soft, non-tender; no masses,  no organomegaly Extremities: extremities normal, atraumatic, no cyanosis or edema Skin: Skin color, texture, turgor normal. No rashes or lesions Lymph nodes: Cervical, supraclavicular, and axillary nodes normal. No abnormal inguinal nodes palpated Neurologic: Grossly normal   Pelvic: External genitalia:  no lesions              Urethra:  normal appearing urethra with no masses, tenderness or lesions              Bartholin's and Skene's: normal                 Vagina: normal appearing vagina with normal color and discharge, no lesions              Cervix: {exam; cervix:14595}              Pap taken: {yes no:314532} Bimanual Exam:  Uterus:  {exam; uterus:12215}  Adnexa: {exam; adnexa:12223}               Rectovaginal: Confirms               Anus:  normal sphincter tone, no lesions  Chaperone present: ***  A:  Well Woman with normal exam  P:   Reviewed health and wellness pertinent to exam  Pap smear: {YES NO:22349}  {plan; gyn:5269::"mammogram","pap smear","return annually or prn"}  An After Visit Summary was printed and given to the patient.

## 2018-12-05 ENCOUNTER — Other Ambulatory Visit: Payer: Self-pay

## 2018-12-05 ENCOUNTER — Ambulatory Visit (INDEPENDENT_AMBULATORY_CARE_PROVIDER_SITE_OTHER): Payer: 59 | Admitting: Certified Nurse Midwife

## 2018-12-05 ENCOUNTER — Other Ambulatory Visit (HOSPITAL_COMMUNITY)
Admission: RE | Admit: 2018-12-05 | Discharge: 2018-12-05 | Disposition: A | Discharge status: Home / Self Care | Payer: 59 | Source: Ambulatory Visit | Attending: Certified Nurse Midwife | Admitting: Certified Nurse Midwife

## 2018-12-05 ENCOUNTER — Encounter: Payer: Self-pay | Admitting: Certified Nurse Midwife

## 2018-12-05 VITALS — BP 112/62 | HR 60 | Resp 16 | Ht 66.75 in | Wt 150.0 lb

## 2018-12-05 DIAGNOSIS — Z3041 Encounter for surveillance of contraceptive pills: Secondary | ICD-10-CM

## 2018-12-05 DIAGNOSIS — Z01419 Encounter for gynecological examination (general) (routine) without abnormal findings: Secondary | ICD-10-CM

## 2018-12-05 DIAGNOSIS — Z124 Encounter for screening for malignant neoplasm of cervix: Secondary | ICD-10-CM

## 2018-12-05 MED ORDER — NORETHINDRONE 0.35 MG PO TABS: ORAL_TABLET | ORAL | 12 refills | Status: DC

## 2018-12-05 NOTE — Patient Instructions (Signed)

## 2018-12-05 NOTE — Progress Notes (Signed)
38 y.o. G3P0030 Single  Caucasian Fe here for annual exam. Contraception POP working well with lighter monthly  period. Denies any warning signs with use. No STD concerns no partner change. Sees Urgent care if needed.No health issues today.  Patient's last menstrual period was 11/28/2018 (exact date).          Sexually active: Yes.    The current method of family planning is oral progesterone-only contraceptive.    Exercising: Yes.    cardio Smoker:  no  Review of Systems  Constitutional: Negative.   HENT: Negative.   Eyes: Negative.   Respiratory: Negative.   Cardiovascular: Negative.   Gastrointestinal: Negative.   Genitourinary: Negative.   Musculoskeletal: Negative.   Skin: Negative.   Neurological: Negative.   Endo/Heme/Allergies: Negative.   Psychiatric/Behavioral: Negative.     Health Maintenance: Pap:  10-07-15 neg History of Abnormal Pap: no MMG:  none Self Breast exams: yes Colonoscopy:  none BMD:   none TDaP:  2018 Shingles: no Pneumonia: no Hep C and HIV: both neg 2018 Labs: if needed   reports that she has quit smoking. Her smoking use included cigarettes. She has never used smokeless tobacco. She reports current alcohol use. She reports that she does not use drugs.  Past Medical History:  Diagnosis Date  . Sebaceous cyst of skin of left breast    around areola    Past Surgical History:  Procedure Laterality Date  . BREAST CYST EXCISION Left 12/10/2014   Procedure: CYST EXCISION LEFT BREAST AEREOLA;  Surgeon: Almond Lint, MD;  Location: [facility];  Service: General;  Laterality: Left;  . NASAL SINUS SURGERY  2008  . TONSILLECTOMY    . WISDOM TOOTH EXTRACTION      Current Outpatient Medications  Medication Sig Dispense Refill  . Biotin w/ Vitamins C & E (HAIR/SKIN/NAILS PO) Take by mouth.    . Multiple Vitamins-Minerals (MULTIVITAMIN PO) Take by mouth.    . norethindrone (NORLYDA) 0.35 MG tablet TAKE 1 TABLET(0.35 MG) BY MOUTH DAILY  1 tablet 12  . Probiotic Product (PROBIOTIC PO) Take by mouth.     No current facility-administered medications for this visit.     Family History  Problem Relation Age of Onset  . Heart disease Father   . Diabetes Maternal Grandmother   . Heart disease Maternal Grandfather     ROS:  Pertinent items are noted in HPI.  Otherwise, a comprehensive ROS was negative.  Exam:   BP 112/62   Pulse 60   Resp 16   Ht 5' 6.75" (1.695 m)   Wt 150 lb (68 kg)   LMP 11/28/2018 (Exact Date)   BMI 23.67 kg/m  Height: 5' 6.75" (169.5 cm) Ht Readings from Last 3 Encounters:  12/05/18 5' 6.75" (1.695 m)  10/30/17 5' 6.5" (1.689 m)  06/28/17 5' 6.5" (1.689 m)    General appearance: alert, cooperative and appears stated age Head: Normocephalic, without obvious abnormality, atraumatic Neck: no adenopathy, supple, symmetrical, trachea midline and thyroid normal to inspection and palpation Lungs: clear to auscultation bilaterally Breasts: normal appearance, no masses or tenderness, No nipple retraction or dimpling, No nipple discharge or bleeding, No axillary or supraclavicular adenopathy Heart: regular rate and rhythm Abdomen: soft, non-tender; no masses,  no organomegaly Extremities: extremities normal, atraumatic, no cyanosis or edema Skin: Skin color, texture, turgor normal. No rashes or lesions Lymph nodes: Cervical, supraclavicular, and axillary nodes normal. No abnormal inguinal nodes palpated Neurologic: Grossly normal   Pelvic: External genitalia:  no lesions              Urethra:  normal appearing urethra with no masses, tenderness or lesions              Bartholin's and Skene's: normal                 Vagina: normal appearing vagina with normal color and discharge, no lesions              Cervix: no cervical motion tenderness, no lesions and nulliparous appearance              Pap taken: Yes.   Bimanual Exam:  Uterus:  normal size, contour, position, consistency, mobility,  non-tender and anteverted              Adnexa: normal adnexa and no mass, fullness, tenderness               Rectovaginal: Confirms               Anus:  normal sphincter tone, no lesions  Chaperone present: yes  A:  Well Woman with normal exam  Contraception POP working well  P:   Reviewed health and wellness pertinent to exam  Risks/benefits/warning signs and bleeding expectation reviewed, desires continuance  Rx Norlyda see order with instructions  Pap smear: yes   counseled on breast self exam, STD prevention, HIV risk factors and prevention, feminine hygiene, adequate intake of calcium and vitamin D, diet and exercise  return annually or prn  An After Visit Summary was printed and given to the patient.

## 2018-12-09 LAB — CYTOLOGY - PAP
Diagnosis: NEGATIVE
HPV (WINDOPATH): NOT DETECTED

## 2018-12-29 ENCOUNTER — Other Ambulatory Visit: Payer: Self-pay

## 2018-12-29 ENCOUNTER — Ambulatory Visit (INDEPENDENT_AMBULATORY_CARE_PROVIDER_SITE_OTHER): Payer: Self-pay | Admitting: Physician Assistant

## 2018-12-29 VITALS — BP 100/62 | HR 75 | Temp 97.8°F | Resp 16 | Wt 150.0 lb

## 2018-12-29 DIAGNOSIS — R3915 Urgency of urination: Secondary | ICD-10-CM

## 2018-12-29 LAB — POCT URINALYSIS DIPSTICK
Bilirubin, UA: NEGATIVE
Glucose, UA: NEGATIVE
Ketones, UA: NEGATIVE
Leukocytes, UA: NEGATIVE
Nitrite, UA: NEGATIVE
Protein, UA: NEGATIVE
Spec Grav, UA: 1.01 (ref 1.010–1.025)
Urobilinogen, UA: 0.2 E.U./dL
pH, UA: 7 (ref 5.0–8.0)

## 2018-12-29 NOTE — Patient Instructions (Addendum)
Decrease water to 64 oz daily.  Start over the counter cranberry tablets for the next few days as directed.  Avoid caffeine products.  If no improvement in 2 days, suggest going to [facility] or to [facility] office for urine culture.  If you develop new burning with urination, frequency, fever, chills, flank pain, nausea and vomiting, please seek care immediately at ED.

## 2018-12-29 NOTE — Progress Notes (Signed)
12/29/2018 at 11:29 AM  Sue Williams / DOB: 06-Feb-1981 / MRN: [id number]  The patient does not have a problem list on file.  SUBJECTIVE  Sue Williams is a 38 y.o. female who complains of intermittent mild urinary urgency x 4 days. Has been drinking at least 100 oz of water for the past few months so she goes to the bathroom frequently. Has not been going more than usual. Denies dysuria, vaginal discharge, vaginal irritation, hematuria, flank pain, abdominal pain, pelvic pain, cloudy malordorous urine and genital rash. Most recent UTI prior to this was years ago-does not necessarily remind her of her last UTI but just wanted to be have her urine checked today. Denies recent sexually activity. No new sexual partners. No concern for STD. Smokes cigarettes occasionally. LMP 12/24/18, still having a little bit of spotting. She drinks caffeine frequently. Denies hx of DM, HTN, heart disease, kidney disease, and autoimmune disease.   She  has a past medical history of Sebaceous cyst of skin of left breast.    Medications reviewed and updated by myself where necessary, and exist elsewhere in the encounter.   Sue Williams is allergic to codeine and demerol [meperidine]. She  reports that she has quit smoking. Her smoking use included cigarettes. She has never used smokeless tobacco. She reports current alcohol use. She reports that she does not use drugs. She  reports being sexually active and has had partner(s) who are Female. She reports using the following method of birth control/protection: Pill. The patient  has a past surgical history that includes Nasal sinus surgery (2008); Wisdom tooth extraction; Tonsillectomy; and Breast cyst excision (Left, 12/10/2014).  Her family history includes Diabetes in her maternal grandmother; Heart disease in her father and maternal grandfather.  Review of Systems  Constitutional: Negative for chills, diaphoresis and fever.  Gastrointestinal: Negative for abdominal  pain, nausea and vomiting.    OBJECTIVE  Her  weight is 150 lb (68 kg). Her oral temperature is 97.8 F (36.6 C). Her blood pressure is 100/62 and her pulse is 75. Her respiration is 16 and oxygen saturation is 98%.  The patient's body mass index is 23.67 kg/m.  Physical Exam Vitals signs reviewed.  Constitutional:      General: She is not in acute distress.    Appearance: Normal appearance. She is well-developed. She is not ill-appearing or toxic-appearing.  HENT:     Head: Normocephalic and atraumatic.  Eyes:     Conjunctiva/sclera: Conjunctivae normal.  Neck:     Musculoskeletal: Normal range of motion.  Pulmonary:     Effort: Pulmonary effort is normal.  Abdominal:     General: Abdomen is flat.     Palpations: Abdomen is soft.     Tenderness: There is no abdominal tenderness. There is no right CVA tenderness or left CVA tenderness.  Skin:    General: Skin is warm and dry.  Neurological:     Mental Status: She is alert and oriented to person, place, and time.     Results for orders placed or performed in visit on 12/29/18 (from the past 24 hour(s))  POCT urinalysis dipstick     Status: Normal   Collection Time: 12/29/18 11:09 AM  Result Value Ref Range   Color, UA yellow    Clarity, UA clear    Glucose, UA Negative Negative   Bilirubin, UA negative    Ketones, UA negative    Spec Grav, UA 1.010 1.010 - 1.025   Blood, UA  trace    pH, UA 7.0 5.0 - 8.0   Protein, UA Negative Negative   Urobilinogen, UA 0.2 0.2 or 1.0 E.U./dL   Nitrite, UA negative    Leukocytes, UA Negative Negative   Appearance     Odor      ASSESSMENT & PLAN  Sue Williams was seen today for dysuria.  Diagnoses and all orders for this visit:  Urinary urgency -     POCT urinalysis dipstick    Pt is overall well appearing, NAD. VSS-she is afebrile. UA negative for leukocytes and nitrites. Trace blood likely due to spotting from menstrual cycle. Only sx is mild intermittent urinary urgency.  No dysuria or urinary frequency. No fever, chills, flank pain, N/V. No acute findings on PE. Would suggest decreasing water intake from 100oz daily to 64 oz daily. Stop caffeine products and can even do trial of OTC cranberry tablets as directed for the next few days. If no improvement in 2 days, would suggest following up with local urgent care or gyn office for urine micro and urine culture. Advised to seek care sooner at ED if any of her current symptom worsens or she develops new concerning symptoms. Pt voices understanding and agrees with plan.    Benjiman Core, Cordelia Poche  [facility] 12/29/2018 11:29 AM

## 2018-12-31 ENCOUNTER — Ambulatory Visit
Admission: EM | Admit: 2018-12-31 | Discharge: 2018-12-31 | Disposition: A | Discharge status: Home / Self Care | Payer: 59 | Attending: Physician Assistant | Admitting: Physician Assistant

## 2018-12-31 ENCOUNTER — Telehealth: Payer: Self-pay

## 2018-12-31 DIAGNOSIS — R109 Unspecified abdominal pain: Secondary | ICD-10-CM

## 2018-12-31 DIAGNOSIS — R3915 Urgency of urination: Secondary | ICD-10-CM | POA: Diagnosis not present

## 2018-12-31 LAB — POCT URINALYSIS DIP (MANUAL ENTRY)
Bilirubin, UA: NEGATIVE
Blood, UA: NEGATIVE
Glucose, UA: NEGATIVE mg/dL
Ketones, POC UA: NEGATIVE mg/dL
Leukocytes, UA: NEGATIVE
Nitrite, UA: NEGATIVE
Protein Ur, POC: NEGATIVE mg/dL
Spec Grav, UA: 1.015 (ref 1.010–1.025)
Urobilinogen, UA: 0.2 E.U./dL
pH, UA: 7 (ref 5.0–8.0)

## 2018-12-31 MED ORDER — CEPHALEXIN 500 MG PO CAPS: 500.0000 mg | ORAL_CAPSULE | Freq: Two times a day (BID) | ORAL | 0 refills | Status: DC

## 2018-12-31 MED ORDER — FLUCONAZOLE 150 MG PO TABS: 150.0000 mg | ORAL_TABLET | Freq: Every day | ORAL | 0 refills | Status: DC

## 2018-12-31 NOTE — ED Provider Notes (Signed)
[facility]    CSN: [id number] Arrival date & time: 12/31/18  1031     History   Chief Complaint Chief Complaint  Patient presents with  . Urinary Tract Infection    HPI Sue Williams is a 38 y.o. female.   38 year old female comes in for 1 week history of urinary symptoms.  She has had urinary urgency, pressure with urination.  She was [facility] 3 days ago, with dipstick only showing trace hemoglobin.  She was getting off her cycle then, so was told to continue to monitor.  They had recommended decreasing fluid intake.  Patient states has tried decreasing fluid intake, discontinuing caffeine products, taking care of very tablets without relief.  She denies dysuria.  States maybe some frequency given she feels as if she is not voiding completely.  She denies abdominal pain, nausea, vomiting.  Denies fever, chills, night sweats.  She has had normal bowel movements, once daily, no straining.  Denies any changes in diet recently.  Denies history of bladder problems apart from occasional UTIs.  Denies vaginal discharge, itching, spotting.     Past Medical History:  Diagnosis Date  . Sebaceous cyst of skin of left breast    around areola    There are no active problems to display for this patient.   Past Surgical History:  Procedure Laterality Date  . BREAST CYST EXCISION Left 12/10/2014   Procedure: CYST EXCISION LEFT BREAST AEREOLA;  Surgeon: Almond LintFaera Byerly, MD;  Location: [facility];  Service: General;  Laterality: Left;  . NASAL SINUS SURGERY  2008  . TONSILLECTOMY    . WISDOM TOOTH EXTRACTION      OB History    Gravida  3   Para      Term      Preterm      AB  3   Living  0     SAB      TAB  3   Ectopic      Multiple      Live Births               Home Medications    Prior to Admission medications   Medication Sig Start Date End Date Taking? Authorizing Provider  Biotin w/ Vitamins C & E (HAIR/SKIN/NAILS  PO) Take by mouth.    [provider]  cephALEXin (KEFLEX) 500 MG capsule Take 1 capsule (500 mg total) by mouth 2 (two) times daily. 12/31/18   Cathie HoopsYu, Ruta Capece V, PA-C  fluconazole (DIFLUCAN) 150 MG tablet Take 1 tablet (150 mg total) by mouth daily. Take second dose 72 hours later if symptoms still persists. 12/31/18   Cathie HoopsYu, Alissia Lory V, PA-C  Multiple Vitamins-Minerals (MULTIVITAMIN PO) Take by mouth.    [provider]  norethindrone (NORLYDA) 0.35 MG tablet TAKE 1 TABLET(0.35 MG) BY MOUTH DAILY 12/05/18   Verner CholLeonard, Deborah S, CNM  Probiotic Product (PROBIOTIC PO) Take by mouth.    [provider]    Family History Family History  Problem Relation Age of Onset  . Heart disease Father   . Diabetes Maternal Grandmother   . Heart disease Maternal Grandfather     Social History Social History   Tobacco Use  . Smoking status: Former Smoker    Types: Cigarettes  . Smokeless tobacco: Never Used  . Tobacco comment: occ  Substance Use Topics  . Alcohol use: Yes    Alcohol/week: 0.0 - 1.0 standard drinks    Comment:  occ  . Drug use: No     Allergies   Codeine and Demerol [meperidine]   Review of Systems Review of Systems  Reason unable to perform ROS: See HPI as above.     Physical Exam Triage Vital Signs ED Triage Vitals [12/31/18 1042]  Enc Vitals Group     BP 113/72     Pulse Rate 89     Resp 18     Temp 98.2 F (36.8 C)     Temp Source Oral     SpO2 97 %     Weight      Height      Head Circumference      Peak Flow      Pain Score 0     Pain Loc      Pain Edu?      Excl. in GC?    No data found.  Updated Vital Signs BP 113/72 (BP Location: Left Arm)   Pulse 89   Temp 98.2 F (36.8 C) (Oral)   Resp 18   LMP 12/24/2018 (Exact Date)   SpO2 97%   Physical Exam Constitutional:      General: She is not in acute distress.    Appearance: She is well-developed. She is not ill-appearing, toxic-appearing or diaphoretic.  HENT:     Head:  Normocephalic and atraumatic.  Eyes:     Conjunctiva/sclera: Conjunctivae normal.     Pupils: Pupils are equal, round, and reactive to light.  Cardiovascular:     Rate and Rhythm: Normal rate and regular rhythm.     Heart sounds: Normal heart sounds. No murmur. No friction rub. No gallop.   Pulmonary:     Effort: Pulmonary effort is normal.     Breath sounds: Normal breath sounds. No wheezing or rales.  Abdominal:     General: Bowel sounds are normal.     Palpations: Abdomen is soft.     Tenderness: There is no abdominal tenderness. There is no right CVA tenderness, left CVA tenderness, guarding or rebound.  Skin:    General: Skin is warm and dry.  Neurological:     Mental Status: She is alert and oriented to person, place, and time.  Psychiatric:        Behavior: Behavior normal.        Judgment: Judgment normal.      UC Treatments / Results  Labs (all labs ordered are listed, but only abnormal results are displayed) Labs Reviewed  URINE CULTURE  POCT URINALYSIS DIP (MANUAL ENTRY)    EKG None  Radiology No results found.  Procedures Procedures (including critical care time)  Medications Ordered in UC Medications - No data to display  Initial Impression / Assessment and Plan / UC Course  I have reviewed the triage vital signs and the nursing notes.  Pertinent labs & imaging results that were available during my care of the patient were reviewed by me and considered in my medical decision making (see chart for details).    Dipstick negative for leukocytes and nitrites.  However, given patient with continued symptoms despite diet changes, will cover for infection with a short course of Keflex 500 mg twice daily x3 days.  Will provide Diflucan to prevent yeast infection.  Urine culture sent.  Patient to follow-up with urology for further evaluation if symptoms do not improving.  Return precautions given.  Patient expresses understanding and agrees to plan.  Final  Clinical Impressions(s) / UC Diagnoses   Final diagnoses:  Urinary urgency  Abdominal pressure   ED Prescriptions    Medication Sig Dispense Auth. Provider   cephALEXin (KEFLEX) 500 MG capsule Take 1 capsule (500 mg total) by mouth 2 (two) times daily. 6 capsule Montrail Mehrer V, PA-C   fluconazole (DIFLUCAN) 150 MG tablet Take 1 tablet (150 mg total) by mouth daily. Take second dose 72 hours later if symptoms still persists. 2 tablet Threasa Alpha, PA-C 12/31/18 1109

## 2018-12-31 NOTE — ED Triage Notes (Signed)
Pt c/o urinary frequency with discomfort x1wk

## 2018-12-31 NOTE — Discharge Instructions (Signed)
Urine dipstick is negative. However, given continued symptoms although diet changes, will cover for infection with keflex. Diflucan called in to cover for yeast infection. Urine culture sent. If symptoms still not improving, follow up with urology for further evaluation needed.

## 2018-12-31 NOTE — Telephone Encounter (Signed)
Patient states she went to the PCP today for follow up.

## 2019-01-01 LAB — URINE CULTURE: Culture: NO GROWTH

## 2019-01-05 ENCOUNTER — Other Ambulatory Visit: Payer: Self-pay

## 2019-01-05 ENCOUNTER — Ambulatory Visit (HOSPITAL_COMMUNITY)
Admission: EM | Admit: 2019-01-05 | Discharge: 2019-01-05 | Disposition: A | Discharge status: Home / Self Care | Payer: 59 | Attending: Family Medicine | Admitting: Family Medicine

## 2019-01-05 ENCOUNTER — Encounter (HOSPITAL_COMMUNITY): Payer: Self-pay

## 2019-01-05 DIAGNOSIS — R300 Dysuria: Secondary | ICD-10-CM

## 2019-01-05 LAB — POCT URINALYSIS DIP (DEVICE)
Bilirubin Urine: NEGATIVE
Glucose, UA: NEGATIVE mg/dL
Ketones, ur: NEGATIVE mg/dL
Leukocytes,Ua: NEGATIVE
Nitrite: NEGATIVE
Protein, ur: NEGATIVE mg/dL
Specific Gravity, Urine: 1.01 (ref 1.005–1.030)
Urobilinogen, UA: 0.2 mg/dL (ref 0.0–1.0)
pH: 7 (ref 5.0–8.0)

## 2019-01-05 NOTE — Discharge Instructions (Addendum)
We did lab testing during this visit.  If there are any abnormal findings that require change in medicine or indicate a positive result, you will be notified.  If all of your tests are normal, you will not be called.  The results will be available in my chart in 3 to 5 days Continue to drink plenty of fluids

## 2019-01-05 NOTE — ED Provider Notes (Signed)
[facility]    CSN: [id number] Arrival date & time: 01/05/19  1045     History   Chief Complaint Chief Complaint  Patient presents with  . Urinary Tract Infection    HPI Sue Williams is a 38 y.o. female.   HPI This is the patient's third visit this month for urinary symptoms.  She has dysuria, urinary frequency, and a sensation she cannot empty her bladder normally.  She states that her outer vaginal area feels irritated.  She states "I can feel it when I sit down".  No real discharge.  No odor.  She was seen twice and had normal urinalyses.  She was treated symptomatically with Keflex at 1 of the visits.  She also took 2 Diflucan.  None of these measures have helped.  She is trying to drink lots of fluids.  He still has regular menstrual periods she is on no new medications.  No new soap lotion powder product to irritate her skin.  She is sexually active with one partner, monogamous for years, does not feel like she has been exposed to any STD.  She states she is looked with a mirror and does not see any open skin or rash.  He has never had recurring vaginitis, UTIs, STD, or herpes Past Medical History:  Diagnosis Date  . Sebaceous cyst of skin of left breast    around areola    There are no active problems to display for this patient.   Past Surgical History:  Procedure Laterality Date  . BREAST CYST EXCISION Left 12/10/2014   Procedure: CYST EXCISION LEFT BREAST AEREOLA;  Surgeon: Almond Lint, MD;  Location: [facility];  Service: General;  Laterality: Left;  . NASAL SINUS SURGERY  2008  . TONSILLECTOMY    . WISDOM TOOTH EXTRACTION      OB History    Gravida  3   Para      Term      Preterm      AB  3   Living  0     SAB      TAB  3   Ectopic      Multiple      Live Births               Home Medications    Prior to Admission medications   Medication Sig Start Date End Date Taking? Authorizing Provider  Biotin w/  Vitamins C & E (HAIR/SKIN/NAILS PO) Take by mouth.    [provider]  Multiple Vitamins-Minerals (MULTIVITAMIN PO) Take by mouth.    [provider]  norethindrone (NORLYDA) 0.35 MG tablet TAKE 1 TABLET(0.35 MG) BY MOUTH DAILY 12/05/18   Verner Chol, CNM  Probiotic Product (PROBIOTIC PO) Take by mouth.    [provider]    Family History Family History  Problem Relation Age of Onset  . Heart disease Father   . Diabetes Maternal Grandmother   . Heart disease Maternal Grandfather     Social History Social History   Tobacco Use  . Smoking status: Former Smoker    Types: Cigarettes  . Smokeless tobacco: Never Used  . Tobacco comment: occ  Substance Use Topics  . Alcohol use: Yes    Alcohol/week: 0.0 - 1.0 standard drinks    Comment: occ  . Drug use: No     Allergies   Codeine and Demerol [meperidine]   Review of Systems Review of Systems  Constitutional: Negative for  chills and fever.  HENT: Negative for ear pain and sore throat.   Eyes: Negative for pain and visual disturbance.  Respiratory: Negative for cough and shortness of breath.   Cardiovascular: Negative for chest pain and palpitations.  Gastrointestinal: Negative for abdominal pain and vomiting.  Genitourinary: Positive for dysuria and vaginal pain. Negative for hematuria.  Musculoskeletal: Negative for arthralgias and back pain.  Skin: Negative for color change and rash.  Neurological: Negative for seizures and syncope.  All other systems reviewed and are negative.    Physical Exam Triage Vital Signs ED Triage Vitals  Enc Vitals Group     BP 01/05/19 1102 128/87     Pulse Rate 01/05/19 1102 75     Resp 01/05/19 1102 18     Temp 01/05/19 1102 98.2 F (36.8 C)     Temp Source 01/05/19 1102 Oral     SpO2 01/05/19 1102 97 %     Weight --      Height --      Head Circumference --      Peak Flow --      Pain Score 01/05/19 1101 3     Pain Loc --      Pain Edu? --       Excl. in GC? --    No data found.  Updated Vital Signs BP 128/87 (BP Location: Left Arm)   Pulse 75   Temp 98.2 F (36.8 C) (Oral)   Resp 18   LMP 12/24/2018 (Exact Date)   SpO2 97%      Physical Exam Constitutional:      General: She is not in acute distress.    Appearance: She is well-developed and normal weight.  HENT:     Head: Normocephalic and atraumatic.  Eyes:     Conjunctiva/sclera: Conjunctivae normal.     Pupils: Pupils are equal, round, and reactive to light.  Neck:     Musculoskeletal: Normal range of motion.  Cardiovascular:     Rate and Rhythm: Normal rate and regular rhythm.     Heart sounds: Normal heart sounds.  Pulmonary:     Effort: Pulmonary effort is normal. No respiratory distress.     Breath sounds: Normal breath sounds.  Abdominal:     General: Abdomen is flat. There is no distension.     Palpations: Abdomen is soft.  Genitourinary:    General: Normal vulva.     Comments: Mild irritation of the outer vaginal, introitus, labia minus tissues.  No ulcerations.  No discharge.  No odor. Musculoskeletal: Normal range of motion.  Skin:    General: Skin is warm and dry.  Neurological:     General: No focal deficit present.     Mental Status: She is alert.  Psychiatric:        Mood and Affect: Mood normal.        Thought Content: Thought content normal.      UC Treatments / Results  Labs (all labs ordered are listed, but only abnormal results are displayed) Labs Reviewed  POCT URINALYSIS DIP (DEVICE) - Abnormal; Notable for the following components:      Result Value   Hgb urine dipstick TRACE (*)    All other components within normal limits  CERVICOVAGINAL ANCILLARY ONLY    EKG None  Radiology No results found.  Procedures Procedures (including critical care time)  Medications Ordered in UC Medications - No data to display  Initial Impression / Assessment and Plan / UC Course  I have reviewed the triage vital signs and the  nursing notes.  Pertinent labs & imaging results that were available during my care of the patient were reviewed by me and considered in my medical decision making (see chart for details).     She does not appear to have cystitis.  She is had 2 normal urinalysis and a negative urine culture.  She has a vaginitis of some sort I believe that is causing her symptoms.  The 2 Diflucan did not help.  I am going to swab for gonorrhea chlamydia trichomonas BV and Candida.  I am not treating her today.  Will wait for test results come back.  I told her to avoid harsh soaps.  Use an vaginal emollient like vaginal cell which will help with itching and discomfort.  Call her GYN if not better by next week Final Clinical Impressions(s) / UC Diagnoses   Final diagnoses:  Dysuria     Discharge Instructions     We did lab testing during this visit.  If there are any abnormal findings that require change in medicine or indicate a positive result, you will be notified.  If all of your tests are normal, you will not be called.  The results will be available in my chart in 3 to 5 days Continue to drink plenty of fluids    ED Prescriptions    None     Controlled Substance Prescriptions [place] Controlled Substance Registry consulted? Not Applicable   Eustace MooreNelson, Alleene Stoy Sue, MD 01/05/19 1301

## 2019-01-05 NOTE — ED Triage Notes (Signed)
Patient presents to Urgent Care with complaints of urinary frequency and burning since about 2 weeks. Patient states she has been seen for same and UTI was tested, came back negative but she is still having symptoms.

## 2019-01-08 LAB — CERVICOVAGINAL ANCILLARY ONLY
Bacterial vaginitis: NEGATIVE
Candida vaginitis: NEGATIVE
Chlamydia: NEGATIVE
Neisseria Gonorrhea: NEGATIVE
Trichomonas: NEGATIVE

## 2019-01-20 ENCOUNTER — Telehealth: Payer: Self-pay | Admitting: Certified Nurse Midwife

## 2019-01-20 NOTE — Telephone Encounter (Signed)
Spoke with patient. Patient states that she has been having vaginal burning for 1 month. Was seen at an Urgent Care and tested for a UTI, gc/chl, BV, yeast, and trich which all returned negative. Denies any vaginal discharge or pelvic pain. Feels uncomfortable when she is sitting. Reports vaginal burning is all external. Switched detergents to a hypo allergic detergent. Denies changing any soaps recently. Appointment scheduled for 01/24/2019 at 1 pm with Dr.Jertson. Patient declines earlier appointment due to work schedule. Advised if she would like to be seen earlier to contact the office.  Routing to provider and will close encounter.

## 2019-01-20 NOTE — Telephone Encounter (Signed)
Patient is experiencing vaginal burning.

## 2019-01-23 NOTE — Progress Notes (Signed)
GYNECOLOGY  VISIT   HPI: 38 y.o.   Single White or Caucasian Not Hispanic or Latino  female   G21P0030 with Patient's last menstrual period was 01/19/2019.   here for vaginitis. She feels like her vulvar skin, upper thighs and peri-anal area have "been on fire" for the last month. It feels a little better currently. She has changed to a hypoallergenic detergent. Currently very mild. No increase in vaginal d/c, no odor.     Over the last month, she has been voiding more frequently. Normal amounts, drinks a lot of water. Feels she is emptying her bladder. Negative urine culture on 12/31/18. Negative vaginitis swab on 01/05/19. No leakage or urgency.  GYNECOLOGIC HISTORY: Patient's last menstrual period was 01/19/2019. Contraception: norlyda Menopausal hormone therapy: none        OB History    Gravida  3   Para      Term      Preterm      AB  3   Living  0     SAB      TAB  3   Ectopic      Multiple      Live Births                 There are no active problems to display for this patient.   Past Medical History:  Diagnosis Date  . Sebaceous cyst of skin of left breast    around areola    Past Surgical History:  Procedure Laterality Date  . BREAST CYST EXCISION Left 12/10/2014   Procedure: CYST EXCISION LEFT BREAST AEREOLA;  Surgeon: Almond Lint, MD;  Location: [facility];  Service: General;  Laterality: Left;  . NASAL SINUS SURGERY  2008  . TONSILLECTOMY    . WISDOM TOOTH EXTRACTION      Current Outpatient Medications  Medication Sig Dispense Refill  . Biotin w/ Vitamins C & E (HAIR/SKIN/NAILS PO) Take by mouth.    . Multiple Vitamins-Minerals (MULTIVITAMIN PO) Take by mouth.    . norethindrone (NORLYDA) 0.35 MG tablet TAKE 1 TABLET(0.35 MG) BY MOUTH DAILY 1 Package 12  . Probiotic Product (PROBIOTIC PO) Take by mouth.     No current facility-administered medications for this visit.      ALLERGIES: Codeine and Demerol  [meperidine]  Family History  Problem Relation Age of Onset  . Heart disease Father   . Diabetes Maternal Grandmother   . Heart disease Maternal Grandfather     Social History   Socioeconomic History  . Marital status: Single    Spouse name: Not on file  . Number of children: Not on file  . Years of education: Not on file  . Highest education level: Not on file  Occupational History  . Not on file  Social Needs  . Financial resource strain: Not on file  . Food insecurity:    Worry: Not on file    Inability: Not on file  . Transportation needs:    Medical: Not on file    Non-medical: Not on file  Tobacco Use  . Smoking status: Former Smoker    Types: Cigarettes  . Smokeless tobacco: Never Used  . Tobacco comment: occ  Substance and Sexual Activity  . Alcohol use: Yes    Alcohol/week: 0.0 - 1.0 standard drinks    Comment: occ  . Drug use: No  . Sexual activity: Yes    Partners: Male    Birth control/protection: Pill  Lifestyle  .  Physical activity:    Days per week: Not on file    Minutes per session: Not on file  . Stress: Not on file  Relationships  . Social connections:    Talks on phone: Not on file    Gets together: Not on file    Attends religious service: Not on file    Active member of club or organization: Not on file    Attends meetings of clubs or organizations: Not on file    Relationship status: Not on file  . Intimate partner violence:    Fear of current or ex partner: Not on file    Emotionally abused: Not on file    Physically abused: Not on file    Forced sexual activity: Not on file  Other Topics Concern  . Not on file  Social History Narrative  . Not on file    Review of Systems  Constitutional: Negative.   HENT: Negative.   Eyes: Negative.   Respiratory: Negative.   Cardiovascular: Negative.   Gastrointestinal: Negative.   Genitourinary: Negative.   Musculoskeletal: Negative.   Skin:       External vaginal burning   Neurological: Negative.   Endo/Heme/Allergies: Negative.   Psychiatric/Behavioral: Negative.     PHYSICAL EXAMINATION:    BP 106/64   Pulse 68   Temp 98.3 F (36.8 C) (Oral)   Resp 16   Wt 149 lb (67.6 kg)   LMP 01/19/2019   BMI 23.51 kg/m     General appearance: alert, cooperative and appears stated age  Pelvic: External genitalia:  no lesions, no rashes              Urethra:  normal appearing urethra with no masses, tenderness or lesions              Bartholins and Skenes: normal                 Vagina: normal appearing vagina with normal color and a slight increase in thick, white vaginal d/c              Cervix:  no lesions  Chaperone was present for exam.  ASSESSMENT Vulvar, perianal, upper thigh burning. Improved with change in detergent Frequent urination, normal amounts, negative culture    PLAN Send nuswab vaginitis panel Discussed vulvar skin care Can use Vaseline daily. Call with change or worsening in symptoms   An After Visit Summary was printed and given to the patient.  ~15 minutes face to face time of which over 50% was spent in counseling.

## 2019-01-24 ENCOUNTER — Encounter: Payer: Self-pay | Admitting: Obstetrics and Gynecology

## 2019-01-24 ENCOUNTER — Other Ambulatory Visit: Payer: Self-pay

## 2019-01-24 ENCOUNTER — Ambulatory Visit: Payer: 59 | Admitting: Obstetrics and Gynecology

## 2019-01-24 VITALS — BP 106/64 | HR 68 | Temp 98.3°F | Resp 16 | Wt 149.0 lb

## 2019-01-24 DIAGNOSIS — N763 Subacute and chronic vulvitis: Secondary | ICD-10-CM | POA: Diagnosis not present

## 2019-01-24 DIAGNOSIS — R350 Frequency of micturition: Secondary | ICD-10-CM | POA: Diagnosis not present

## 2019-02-01 LAB — NUSWAB VAGINITIS (VG)
Candida albicans, NAA: NEGATIVE
Candida glabrata, NAA: NEGATIVE
Trich vag by NAA: NEGATIVE

## 2019-05-19 ENCOUNTER — Other Ambulatory Visit: Payer: Self-pay

## 2019-05-21 ENCOUNTER — Other Ambulatory Visit: Payer: Self-pay

## 2019-05-21 ENCOUNTER — Ambulatory Visit: Payer: 59 | Admitting: Certified Nurse Midwife

## 2019-05-21 ENCOUNTER — Encounter: Payer: Self-pay | Admitting: Certified Nurse Midwife

## 2019-05-21 VITALS — BP 108/64 | HR 64 | Temp 97.3°F | Resp 16 | Wt 155.0 lb

## 2019-05-21 DIAGNOSIS — N912 Amenorrhea, unspecified: Secondary | ICD-10-CM

## 2019-05-21 LAB — POCT URINE PREGNANCY: Preg Test, Ur: POSITIVE — AB

## 2019-05-21 NOTE — Patient Instructions (Signed)
Pregnancy and the Partner's Role  You have an important role during your partner's pregnancy, labor, and delivery. To be helpful and supportive during this time, you should know what to expect. What are the stages of pregnancy? Pregnancy usually lasts for about 40 weeks. The pregnancy period is divided into 3 trimesters. First trimester (0-13 weeks) During this trimester, your partner may:  Feel tired.  Have painful breasts.  Feel nauseous and vomit.  Urinate more often.  Have mood changes. All of these changes are normal. Try to be helpful, supportive, and understanding. This may include helping with household chores and spending more time with each other. Second trimester (14-28 weeks) During this trimester:  Your partner may feel better and more energetic. This is the best time to be more active together.  You will be able to see her belly showing the pregnancy.  You may be able to feel the baby kick.  Your partner may have soreness or aching in her back as she gains weight. You can help by carrying heavy things and by rubbing her back when she is feeling sore. Third trimester (29-40 weeks) During the final weeks, your partner may:  Become more uncomfortable as the baby grows.  Have a hard time doing everyday activities, and her balance may be off.  Have a hard time bending over.  Get tired easily.  Have difficulty sleeping. At this time, the birth of your baby is close. You and your partner may have concerns or questions. This is normal. Talk with each other and with your health care provider. Continue to help your partner with household chores, encourage her to rest, and rub her sore back and legs, if this helps her. How to support your partner during pregnancy There are many things you can do to support your partner during her pregnancy. Emotional support During your partner's pregnancy, you and your partner may:  Feel happy, excited, or proud.  Have concerns  about finances or other new responsibilities.  Feel overwhelmed or scared.  Worry about changes in your relationship with your partner. These feelings are normal. Talk about them openly with your partner, trusted family members, other new parents, or your health care provider. Find ways to relax and manage stress together, such as taking a walk or listening to music. Prenatal care Attend prenatal care visits with your partner. This is a good time for you to get to know your health care provider, follow the pregnancy, and ask questions. You may have prenatal visits:  Once a month for the first 6 months.  Once every 2 weeks from 6-8 months.  Once a week during the last month.  You may have more prenatal visits if your health care provider believes that they are needed. Sex and intimacy It's important to stay connected with your partner during pregnancy, since your relationship may experience changes once baby arrives. Sexual intercourse is safe unless there is a problem with the pregnancy and your health care provider advises you not to have sex. Your partner may not want to have sex during certain times due to pregnancy-related physical and emotional changes. To continue to be intimate:  Discuss your feelings and desires.  Try different positions to make sex more comfortable.  Find other ways to be intimate, such as massage.  Talk with your health care provider about any questions that you may have about sexual intercourse during pregnancy. Childbirth classes Attend childbirth classes with your partner if you are able. Classes help you and  your partner bond by preparing for labor and delivery. Classes teach you:  What happens during labor and delivery.  How to emotionally and physically support your partner.  Relaxation techniques.  How to work through your partner's labor pains.  How to focus during labor and delivery. Follow these instructions at home:  Talk with your  partner about her preferences for labor and delivery. If you plan to be present during labor and delivery, talk about: ? Her pain management preferences. You can help manage pain with massage and positioning your partner during labor. You can also help advocate for her preferences. ? How the baby will be delivered. Depending on your partner's health and pregnancy history, she may be able to attempt a vaginal delivery or may need to schedule a cesarean delivery, or C-section. ? If you would like to help with the delivery or cut your baby's umbilical cord.  Find ways to support your partner's health during pregnancy: ? Eat a healthy, well-balanced diet with your partner. ? Exercise with your partner. Exercising regularly during pregnancy can boost your partner's mood, reduce pain and swelling, and may even help prepare your partner to give birth. ? Make sure you are up to date on all recommended vaccines like the annual flu shot. ? Do not smoke or use e-cigarettes around your partner. When your partner breathes in smoke, the harmful substances that are inhaled can pass to your baby through the placenta. Summary  To be helpful and supportive during your partner's pregnancy, you can provide emotional support, find ways to be intimate with your partner, and attend prenatal care visits and childbirth classes.  Attending prenatal care visits with your partner helps you get to know your health care provider, follow the pregnancy, and ask questions.  It is important to talk with your partner and your health care provider if you are worried or have any questions.  Talk with your partner about her preferences for labor and delivery. Be prepared to be an advocate for her. This information is not intended to replace advice given to you by your health care provider. Make sure you discuss any questions you have with your health care provider. Document Released: 02/28/2008 Document Revised: 01/03/2019 Document  Reviewed: 09/24/2017 Elsevier Patient Education  2020 [employer].

## 2019-05-21 NOTE — Progress Notes (Signed)
38 y.o. single  Female G3P0030 presents with amenorrhea with home + UPT on 05/19/2019. LMP 04/07/2019. Planned pregnancy. Stopped POP 12/2018. Complaining of breast tenderness, fatigue, nausea with vomiting X 1 only. Denies spotting, bleeding or cramping. Medications she is taking are: Prenatal Vitamins only. Patient has not consumed alcohol since + UPT. Partner supportive.  Review of Systems  Constitutional: Negative.   HENT: Negative.   Eyes: Negative.   Respiratory: Negative.   Cardiovascular: Negative.   Gastrointestinal: Positive for nausea. Negative for abdominal pain.  Genitourinary: Negative.   Musculoskeletal: Negative.   Skin: Negative.   Neurological: Negative.   Endo/Heme/Allergies: Negative.   Psychiatric/Behavioral: Negative.     O: HPI pertinent to above. Healthy WDWN female Affect: normal, orientation x 3  Last Aex: 12/05/2018 Pap smear: 12/05/2018 negative         Rubella screen:10/19/2016 positive immunity  A: Amenorrhea with positive UPT  6 wk 2 days per [facility] with [facility] 01/13/2019 Planned pregnancy   P: Reviewed with patient importance of prenatal care during pregnancy. Given OB provider list. Reviewed nutrition importance of pregnancy and selecting from all food groups and making sure to have adequate protein intake daily. Discussed avoiding raw or exotic fish, soft cheeses due to risk of bacteria . Discussed concerns with FAS with alcohol use in pregnancy. Discussed increase of IUGR and SIDS with smoking use or second smoke. Reviewed warning signs of early pregnancy and need to advise if occurs. Discussed comfort measures for early pregnancy changes. Offered viability PUS here prior to initiating prenatal care. Patient  plans to have PUS here. She will be called with insurance information and scheduled. Questions addressed at length.  Time spent in face to face consultation regarding pregnancy/prenatal care was. 22 minutes  Rv prn

## 2019-05-22 ENCOUNTER — Telehealth: Payer: Self-pay | Admitting: *Deleted

## 2019-05-22 DIAGNOSIS — Z3201 Encounter for pregnancy test, result positive: Secondary | ICD-10-CM

## 2019-05-22 DIAGNOSIS — N912 Amenorrhea, unspecified: Secondary | ICD-10-CM

## 2019-05-22 NOTE — Telephone Encounter (Signed)
Spoke with patient. Patient request to schedule PUS on a Thursday. PUS scheduled for 9/3 at 8am, consult at 8:30am with Dr. Quincy Simmonds. Order placed for precert. Patient verbalizes understanding and is agreeable.   Routing to provider for final review. Patient is agreeable to disposition. Will close encounter.  Cc: Dr. Quincy Simmonds, Lerry Liner, [place]

## 2019-05-22 NOTE — Telephone Encounter (Signed)
Left message to call Sharee Pimple, RN at [facility].    Order placed for PUS with Dr. Talbert Nan.

## 2019-05-22 NOTE — Telephone Encounter (Signed)
-----   Message from Regina Eck, CNM sent at 05/21/2019  9:29 PM EDT ----- Patient needs PUS scheduled to confirm pregnancy next week.She is aware she will be called with insurance information and scheduled

## 2019-05-26 ENCOUNTER — Telehealth: Payer: Self-pay | Admitting: Certified Nurse Midwife

## 2019-05-26 ENCOUNTER — Other Ambulatory Visit: Payer: Self-pay | Admitting: Certified Nurse Midwife

## 2019-05-26 NOTE — Telephone Encounter (Signed)
Spoke with patient, advised as seen below per Deborah Leonard, CNM. Patient verbalizes understanding and is agreeable.   Routing to provider for final review. Patient is agreeable to disposition. Will close encounter.  

## 2019-05-26 NOTE — Telephone Encounter (Signed)
Patient states she is about 7 or [redacted] weeks pregnant and she is extremely nauseous and is experiencing ocassional vomiting. Would like a call back from the nurse.

## 2019-05-26 NOTE — Telephone Encounter (Signed)
Spoke with patient. Patient is [redacted]wks pregnant, experiencing worsening nausea, occasional vomiting. Wakes up in the middle of the night nauseous. Has tried OTC Sea bands, ginger chews, ginger tea and eating small meals. Drinking some fluids, not as much as she use to, only dry heaves when vomiting. Denies vaginal bleeding, pain, fever/chills or diarrhea. Patient is asking for Rx options or other recommendations. Is scheduled for viability scan with Dr. Quincy Simmonds on 9/3 at [place] patient if unable to keep down fluids and vomiting, will need to go to [facility] for further evaluation. Will review with Melvia Heaps, CNM and return call, patient agreeable.   Melvia Heaps, CNM -please advise.   Cc: Dr. Quincy Simmonds

## 2019-05-26 NOTE — Telephone Encounter (Signed)
Left message to call Sharee Pimple, RN at [facility].    Call reviewed with Melvia Heaps, CNM.  Recommended OTC Vitamin B 6 with 1/2 tablet Doxylamine (Unisom) 12.5 mg tid as needed for nausea

## 2019-05-27 ENCOUNTER — Other Ambulatory Visit: Payer: Self-pay

## 2019-05-29 ENCOUNTER — Other Ambulatory Visit: Payer: Self-pay

## 2019-05-29 ENCOUNTER — Encounter: Payer: Self-pay | Admitting: Obstetrics and Gynecology

## 2019-05-29 ENCOUNTER — Ambulatory Visit (INDEPENDENT_AMBULATORY_CARE_PROVIDER_SITE_OTHER): Payer: 59

## 2019-05-29 ENCOUNTER — Ambulatory Visit: Payer: 59 | Admitting: Obstetrics and Gynecology

## 2019-05-29 VITALS — BP 100/64 | HR 66 | Temp 97.3°F | Ht 66.75 in | Wt 152.8 lb

## 2019-05-29 DIAGNOSIS — O418X10 Other specified disorders of amniotic fluid and membranes, first trimester, not applicable or unspecified: Secondary | ICD-10-CM

## 2019-05-29 DIAGNOSIS — Z3490 Encounter for supervision of normal pregnancy, unspecified, unspecified trimester: Secondary | ICD-10-CM

## 2019-05-29 DIAGNOSIS — Z3201 Encounter for pregnancy test, result positive: Secondary | ICD-10-CM

## 2019-05-29 DIAGNOSIS — Z3687 Encounter for antenatal screening for uncertain dates: Secondary | ICD-10-CM

## 2019-05-29 DIAGNOSIS — N912 Amenorrhea, unspecified: Secondary | ICD-10-CM | POA: Diagnosis not present

## 2019-05-29 NOTE — Progress Notes (Signed)
GYNECOLOGY  VISIT   HPI: 38 y.o.   Single  Caucasian  female   G23P0030 with Patient's last menstrual period was 04/07/2019.   here for viability ultrasound.    Having some nausea.  Taking vit B6 and 1/2 Unisom, and it is helping some.  Some fatigue.   Had 3 VIPs.  No complications.   GYNECOLOGIC HISTORY: Patient's last menstrual period was 04/07/2019. Contraception:  none Menopausal hormone therapy:  n/a Last mammogram:  n/a Last pap smear:  12-05-18 Neg:Neg HR HPV                              10-07-15 Neg OB History    Gravida  4   Para      Term      Preterm      AB  3   Living  0     SAB      TAB  3   Ectopic      Multiple      Live Births                 There are no active problems to display for this patient.   Past Medical History:  Diagnosis Date  . Sebaceous cyst of skin of left breast    around areola    Past Surgical History:  Procedure Laterality Date  . BREAST CYST EXCISION Left 12/10/2014   Procedure: CYST EXCISION LEFT BREAST AEREOLA;  Surgeon: Stark Klein, MD;  Location: [facility];  Service: General;  Laterality: Left;  . NASAL SINUS SURGERY  2008  . TONSILLECTOMY    . WISDOM TOOTH EXTRACTION      Current Outpatient Medications  Medication Sig Dispense Refill  . Doxylamine Succinate, Sleep, (UNISOM PO) Take 1 tablet by mouth as needed.    . Prenatal Vit-Fe Fumarate-FA (PRENATAL VITAMIN PO) Take by mouth.    . Pyridoxine HCl (VITAMIN B-6 PO) Take 1 tablet by mouth 2 (two) times daily.     No current facility-administered medications for this visit.      ALLERGIES: Codeine and Demerol [meperidine]  Family History  Problem Relation Age of Onset  . Heart disease Father   . Diabetes Maternal Grandmother   . Heart disease Maternal Grandfather     Social History   Socioeconomic History  . Marital status: Single    Spouse name: Not on file  . Number of children: Not on file  . Years of education: Not on  file  . Highest education level: Not on file  Occupational History  . Not on file  Social Needs  . Financial resource strain: Not on file  . Food insecurity    Worry: Not on file    Inability: Not on file  . Transportation needs    Medical: Not on file    Non-medical: Not on file  Tobacco Use  . Smoking status: Former Smoker    Types: Cigarettes  . Smokeless tobacco: Never Used  . Tobacco comment: occ  Substance and Sexual Activity  . Alcohol use: Yes    Comment: occ  . Drug use: No  . Sexual activity: Yes    Partners: Male    Birth control/protection: None  Lifestyle  . Physical activity    Days per week: Not on file    Minutes per session: Not on file  . Stress: Not on file  Relationships  . Social connections  Talks on phone: Not on file    Gets together: Not on file    Attends religious service: Not on file    Active member of club or organization: Not on file    Attends meetings of clubs or organizations: Not on file    Relationship status: Not on file  . Intimate partner violence    Fear of current or ex partner: Not on file    Emotionally abused: Not on file    Physically abused: Not on file    Forced sexual activity: Not on file  Other Topics Concern  . Not on file  Social History Narrative  . Not on file    Review of Systems  Gastrointestinal: Positive for nausea.  All other systems reviewed and are negative.   PHYSICAL EXAMINATION:    BP 100/64   Pulse 66   Temp (!) 97.3 F (36.3 C)   Ht 5' 6.75" (1.695 m)   Wt 152 lb 12.8 oz (69.3 kg)   LMP 04/07/2019   BMI 24.11 kg/m     General appearance: alert, cooperative and appears stated age   OB US Viable IUP.  S<D.  [facility]red[facility] FH [facility].  Abnormally thickened yolk sac.  Small left CL.  ASSESSMENT  Viable early pregnancy.  Abnormal yolk sac.  Nausea of early pregnancy.  PLAN   Counseling given regarding findings of today's ultrasound.  Will need to repeat  US in the next 7 - 10 days. Patient prefers to return in one week.  OK to continue vit B6 and Unisom.  Call for bleeding, pain or any other concern.   An After Visit Summary was printed and given to the patient.  ___15___ minutes face to face time of which over 50% was spent in counseling.

## 2019-06-04 ENCOUNTER — Telehealth: Payer: Self-pay | Admitting: Obstetrics and Gynecology

## 2019-06-04 NOTE — Telephone Encounter (Signed)
Call placed to patient to review benefit and schedule recommended ultrasound with Dr Silva. Left voicemail message requesting a return call. °

## 2019-06-04 NOTE — Telephone Encounter (Signed)
Patient returned call. reviewed benefit for recommended ultrasound. Order from Dr Quincy Simmonds, indicated to scheduled on 06/05/2019, but patient advises she will not be available on that date. Patient is scheduled 06/12/2019 with Dr Quincy Simmonds. Patient is aware of the appointment date, arrival time and cancellation policy.  Forwarding to Dr Quincy Simmonds for final review. Patient is agreeable to disposition. Will close encounter

## 2019-06-09 ENCOUNTER — Telehealth: Payer: Self-pay | Admitting: *Deleted

## 2019-06-09 NOTE — Telephone Encounter (Signed)
Scheduled for ultrasound this Thursday. Thinks she had miscarriage over weekend.

## 2019-06-09 NOTE — Telephone Encounter (Signed)
Left message to call Donne Baley, RN at [facility] [phone].   

## 2019-06-09 NOTE — Telephone Encounter (Signed)
Patient returned call. Patient states that she went out of town on Friday, started having cramping that night and started bleeding "like a period." States she also was passing clots. Two were "golf ball" sized. Patient states she didn't go to the ER while she was away, because she didn't want to go unless she felt bad. Patient states she feels fine today. Only having spotting off and on. Denies pain. Is traveling home today and will be back in town tonight. Patient asking if she should keep her [place] as scheduled for Thursday or if Dr. Quincy Simmonds will want to see her sooner? RN advised would review with Dr. Quincy Simmonds and return call. Patient agreeable.   Routing to provider for review.

## 2019-06-09 NOTE — Telephone Encounter (Signed)
Ok to keep appointment for Thursday this week for her follow up ultrasound.  Needs to be seen sooner if she develops pain or heavy bleeding.

## 2019-06-10 NOTE — Telephone Encounter (Signed)
Spoke with patient. Denies vaginal bleeding or pain today. Advised as seen below per Dr. Quincy Simmonds. [facility] precautions reviewed for pain or heavy bleeding of after hours. Patient verbalizes understanding.  Routing to provider for final review. Patient is agreeable to disposition. Will close encounter.

## 2019-06-10 NOTE — Telephone Encounter (Signed)
Left message to call Lumi Winslett, RN at [facility] [phone].   

## 2019-06-12 ENCOUNTER — Other Ambulatory Visit: Payer: 59 | Admitting: Obstetrics and Gynecology

## 2019-06-12 ENCOUNTER — Other Ambulatory Visit: Payer: Self-pay | Admitting: Obstetrics and Gynecology

## 2019-06-12 ENCOUNTER — Other Ambulatory Visit: Payer: 59

## 2019-06-12 ENCOUNTER — Telehealth: Payer: Self-pay | Admitting: Obstetrics and Gynecology

## 2019-06-12 DIAGNOSIS — O200 Threatened abortion: Secondary | ICD-10-CM

## 2019-06-12 NOTE — Telephone Encounter (Signed)
Encounter closed

## 2019-06-12 NOTE — Telephone Encounter (Signed)
Patient needs to reschedule ultrasound. Arrived on time for appointment, notified that ultrasound schedule delayed 45 minutes. Patient unable to wait due to work.

## 2019-06-12 NOTE — Telephone Encounter (Signed)
Call reviewed with Dr. Quincy Simmonds, call returned to patient. Patient agreeable to repeat STAT BhcG, will determine if PUS still needed. Patient is still unavailable until next Tuesday for scheduling. Lab appt scheduled for 9/22 at 9:15am, declines earlier appt. Pain and bleeding precautions reviewed, advised I will update Dr. Quincy Simmonds and return call if any additional recommendations.   Routing to Dr. Quincy Simmonds

## 2019-06-12 NOTE — Telephone Encounter (Signed)
I recommend starting with the hCG first.  I would like to add a blood type and Rh also.   I will place future orders.

## 2019-06-12 NOTE — Telephone Encounter (Signed)
Spoke with patient. Patient scheduled for PUS today for viability, was not seen, unable to wait due to work schedule. Patient denies vaginal bleeding, pain or N/V. Patient declines to reschedule for 9/24, request to schedule on 9/22, this is her day off. Advised patient I will have to review with provider and return call, patient agreeable. Patient request to schedule with any available provider on 9/22.

## 2019-06-17 ENCOUNTER — Telehealth: Payer: Self-pay | Admitting: *Deleted

## 2019-06-17 ENCOUNTER — Other Ambulatory Visit (INDEPENDENT_AMBULATORY_CARE_PROVIDER_SITE_OTHER): Payer: 59

## 2019-06-17 ENCOUNTER — Other Ambulatory Visit: Payer: Self-pay

## 2019-06-17 DIAGNOSIS — O200 Threatened abortion: Secondary | ICD-10-CM

## 2019-06-17 LAB — BETA HCG QUANT (REF LAB): hCG Quant: 393 m[IU]/mL

## 2019-06-17 NOTE — Telephone Encounter (Signed)
Left message to call Dandra Shambaugh, RN at [facility] [phone].   

## 2019-06-17 NOTE — Telephone Encounter (Signed)
Spoke with patient. Patient has spoken with her employer and declines to schedule PUS or OV at this time. Patient states "I am aware of risk and my health, but I can't risk my job". Patient states she is agreeable to ultrasound at outside facility "if it can be late afternoon or next week", declines OV until 9/30. Advised patient I will review with Dr. Quincy Simmonds and return call.

## 2019-06-17 NOTE — Telephone Encounter (Signed)
Spoke with patient. Advised PUS and consult recommended, Dr. Quincy Simmonds does not recommended waiting until next week. Further evaluation is needed to determine next steps in care such as treatment with medication or surgical treatment of SAB. Offered PUS appt at 8am on 9/24 with consult to follow. Patient verbalizes understanding. Patient states she needs to contact her employer and return call.

## 2019-06-17 NOTE — Telephone Encounter (Signed)
Please let patient know of hCG which is now 393.  This is a low level of pregnancy hormone, and it is consistent with a failed pregnancy.  I am sorry for this.  I recommend a follow up ultrasound and office visit with me on Thursday am.  We can reassess then to determine next steps in her care.   Her blood type is not back yet.

## 2019-06-17 NOTE — Telephone Encounter (Signed)
Patient has had what appears to be an incomplete miscarriage.  She is at risk for bleeding and potential important infection.  She does need further evaluation and potential treatment.   If she has limitations for timely care in the office this week, I recommend she go to the new [facility] at [facility] for this care.   Our goal is to provide safe care for her.

## 2019-06-17 NOTE — Telephone Encounter (Signed)
Spoke with patient, advised per Dr. Quincy Simmonds. Patient states she will go to [facility] for further evaluation, states she is unsure if she will go today or tomorrow. Advised patient if new symptoms develop such as heavy bleeding, fever/chills, pain, seek immediate evaluation. Patient verbalizes understanding.   Routing to provider for final review. Patient is agreeable to disposition. Will close encounter.

## 2019-06-17 NOTE — Telephone Encounter (Signed)
Patient returned call

## 2019-06-17 NOTE — Telephone Encounter (Signed)
Spoke with patient, advised as seen below per Dr. Quincy Simmonds. Patient declined PUS on 9/24 due to her work schedule. Patient states she is available today and 9/30. Patient denies pain or vaginal bleeding. Advised patient I will review with Dr. Quincy Simmonds and return call, patient agreeable.

## 2019-06-17 NOTE — Telephone Encounter (Signed)
Left message to call Sue Guillotte, RN at [facility] [phone].   

## 2019-06-17 NOTE — Telephone Encounter (Signed)
Spoke with Ebony at [place]: 393  Routing to Dr. Quincy Simmonds

## 2019-06-18 ENCOUNTER — Inpatient Hospital Stay (HOSPITAL_COMMUNITY)
Admission: AD | Admit: 2019-06-18 | Discharge: 2019-06-18 | Disposition: A | Discharge status: Home / Self Care | Payer: 59 | Attending: Obstetrics and Gynecology | Admitting: Obstetrics and Gynecology

## 2019-06-18 ENCOUNTER — Encounter (HOSPITAL_COMMUNITY): Payer: Self-pay | Admitting: *Deleted

## 2019-06-18 ENCOUNTER — Other Ambulatory Visit: Payer: Self-pay

## 2019-06-18 DIAGNOSIS — O039 Complete or unspecified spontaneous abortion without complication: Secondary | ICD-10-CM | POA: Diagnosis not present

## 2019-06-18 DIAGNOSIS — Z6790 Unspecified blood type, Rh positive: Secondary | ICD-10-CM | POA: Diagnosis not present

## 2019-06-18 LAB — ABO AND RH: Rh Factor: POSITIVE

## 2019-06-18 NOTE — MAU Provider Note (Signed)
First Provider Initiated Contact with Patient 06/18/19 3536      S Ms. Sue Williams is a 38 y.o. [id number] non-pregnant female who presents to [facility]U today with complaint of miscarriage. Pt reports she had a miscarriage on 06/07/2019. Pt reports the week prior she had an [place] with a fetus and heartbeat, but irregular shaped-yolk sac per her provider. Pt then reports 5days of bleeding, clots and cramping. Pt was seen at provider's office yesterday and had bHCG drawn. Pt was to have a repeat US, but when she presented for appointment they were running behind and patient was not able to stay d/t work schedule. Pt was called from office to report her hCG was "elevated" at 393, and her provider was concerned that she had retained POCs. Pt denies pain, bleeding, fever, or other abnormal s/sx today. Pt requesting outpatient US/labs rather than being seen today in hospital, citing insurance reasons.  O BP 121/78 (BP Location: Right Arm)   Pulse 77   Temp 98.2 F (36.8 C) (Oral)   Resp 17   Ht 5' 6.75" (1.695 m)   Wt 70.4 kg   LMP 04/07/2019   SpO2 99%   BMI 24.49 kg/m    Patient Vitals for the past 24 hrs:  BP Temp Temp src Pulse Resp SpO2 Height Weight  06/18/19 1457 121/78 98.2 F (36.8 C) Oral 77 17 99 % 5' 6.75" (1.695 m) 70.4 kg   Physical Exam  Constitutional: She is oriented to person, place, and time. She appears well-developed and well-nourished. No distress.  HENT:  Head: Normocephalic and atraumatic.  Respiratory: Effort normal.  Neurological: She is alert and oriented to person, place, and time.  Skin: She is not diaphoretic.  Psychiatric: She has a normal mood and affect. Her behavior is normal. Judgment and thought content normal.   A Miscarriage, per pt report Medical screening exam complete  P Discharge from MAU in stable condition [place] order entered, pt aware to expect phone call Message sent to [facility] clinic to schedule pt in one week for hCG lab and two weeks for  provider visit Warning signs for worsening condition that would warrant emergency follow-up discussed Patient may return to MAU as needed for pregnancy related complaints  [name], Gerrie Nordmann, NP 06/18/2019 4:00 PM

## 2019-06-18 NOTE — Discharge Instructions (Signed)
Miscarriage °A miscarriage is the loss of an unborn baby (fetus) before the 20th week of pregnancy. Most miscarriages happen during the first 3 months of pregnancy. Sometimes, a miscarriage can happen before a woman knows that she is pregnant. °Having a miscarriage can be an emotional experience. If you have had a miscarriage, talk with your health care provider about any questions you may have about miscarrying, the grieving process, and your plans for future pregnancy. °What are the causes? °A miscarriage may be caused by: °· Problems with the genes or chromosomes of the fetus. These problems make it impossible for the baby to develop normally. They are often the result of random errors that occur early in the development of the baby, and are not passed from parent to child (not inherited). °· Infection of the cervix or uterus. °· Conditions that affect hormone balance in the body. °· Problems with the cervix, such as the cervix opening and thinning before pregnancy is at term (cervical insufficiency). °· Problems with the uterus. These may include: °? A uterus with an abnormal shape. °? Fibroids in the uterus. °? Congenital abnormalities. These are problems that were present at birth. °· Certain medical conditions. °· Smoking, drinking alcohol, or using drugs. °· Injury (trauma). °In many cases, the cause of a miscarriage is not known. °What are the signs or symptoms? °Symptoms of this condition include: °· Vaginal bleeding or spotting, with or without cramps or pain. °· Pain or cramping in the abdomen or lower back. °· Passing fluid, tissue, or blood clots from the vagina. °How is this diagnosed? °This condition may be diagnosed based on: °· A physical exam. °· Ultrasound. °· Blood tests. °· Urine tests. °How is this treated? °Treatment for a miscarriage is sometimes not necessary if you naturally pass all the tissue that was in your uterus. If necessary, this condition may be treated with: °· Dilation and  curettage (D&C). This is a procedure in which the cervix is stretched open and the lining of the uterus (endometrium) is scraped. This is done only if tissue from the fetus or placenta remains in the body (incomplete miscarriage). °· Medicines, such as: °? Antibiotic medicine, to treat infection. °? Medicine to help the body pass any remaining tissue. °? Medicine to reduce (contract) the size of the uterus. These medicines may be given if you have a lot of bleeding. °If you have Rh negative blood and your baby was Rh positive, you will need a shot of a medicine called Rh immunoglobulinto protect your future babies from Rh blood problems. "Rh-negative" and "Rh-positive" refer to whether or not the blood has a specific protein found on the surface of red blood cells (Rh factor). °Follow these instructions at home: °Medicines ° °· Take over-the-counter and prescription medicines only as told by your health care provider. °· If you were prescribed antibiotic medicine, take it as told by your health care provider. Do not stop taking the antibiotic even if you start to feel better. °· Do not take NSAIDs, such as aspirin and ibuprofen, unless they are approved by your health care provider. These medicines can cause bleeding. °Activity °· Rest as directed. Ask your health care provider what activities are safe for you. °· Have someone help with home and family responsibilities during this time. °General instructions °· Keep track of the number of sanitary pads you use each day and how soaked (saturated) they are. Write down this information. °· Monitor the amount of tissue or blood clots that   you pass from your vagina. Save any large amounts of tissue for your health care provider to examine. °· Do not use tampons, douche, or have sex until your health care provider approves. °· To help you and your partner with the process of grieving, talk with your health care provider or seek counseling. °· When you are ready, meet with  your health care provider to discuss any important steps you should take for your health. Also, discuss steps you should take to have a healthy pregnancy in the future. °· Keep all follow-up visits as told by your health care provider. This is important. °Where to find more information °· The American Congress of Obstetricians and Gynecologists: www.acog.org °· U.[place]. Department of Health and Human Services Office of Women’s Health: www.womenshealth.gov °Contact a health care provider if: °· You have a fever or chills. °· You have a foul smelling vaginal discharge. °· You have more bleeding instead of less. °Get help right away if: °· You have severe cramps or pain in your back or abdomen. °· You pass blood clots or tissue from your vagina that is walnut-sized or larger. °· You soak more than 1 regular sanitary pad in an hour. °· You become light-headed or weak. °· You pass out. °· You have feelings of sadness that take over your thoughts, or you have thoughts of hurting yourself. °Summary °· Most miscarriages happen in the first 3 months of pregnancy. Sometimes miscarriage happens before a woman even knows that she is pregnant. °· Follow your health care provider's instruction for home care. Keep all follow-up appointments. °· To help you and your partner with the process of grieving, talk with your health care provider or seek counseling. °This information is not intended to replace advice given to you by your health care provider. Make sure you discuss any questions you have with your health care provider. °Document Released: 03/07/2001 Document Revised: 01/03/2019 Document Reviewed: 10/17/2016 °Elsevier Patient Education © 2020 Elsevier Inc. ° ° °Managing Pregnancy Loss °Pregnancy loss can happen any time during a pregnancy. Often the cause is not known. It is rarely because of anything you did. Pregnancy loss in early pregnancy (during the first trimester) is called a miscarriage. This type of pregnancy loss is  the most common. Pregnancy loss that happens after 20 weeks of pregnancy is called fetal demise if the baby's heart stops beating before birth. Fetal demise is much less common. Some women experience spontaneous labor shortly after fetal demise resulting in a stillborn birth (stillbirth). °Any pregnancy loss can be devastating. You will need to recover both physically and emotionally. Most women are able to get pregnant again after a pregnancy loss and deliver a healthy baby. °How to manage emotional recovery ° °Pregnancy loss is very hard emotionally. You may feel many different emotions while you grieve. You may feel sad and angry. You may also feel guilty. It is normal to have periods of crying. Emotional recovery can take longer than physical recovery. It is different for everyone. °Taking these steps can help you in managing this loss: °· Remember that it is unlikely you did anything to cause the pregnancy loss. °· Share your thoughts and feelings with friends, family, and your partner. Remember that your partner is also recovering emotionally. °· Make sure you have a good support system. Do not spend too much time alone. °· Meet with a pregnancy loss counselor or join a pregnancy loss support group. °· Get enough sleep and eat a healthy diet. Return   to regular exercise when you have recovered physically. °· Do not use drugs or alcohol to manage your emotions. °· Consider seeing a mental health professional to help you recover emotionally. °· Ask a friend or loved one to help you decide what to do with any clothing and nursery items you received for your baby. °In the case of a stillbirth, many women benefit from taking additional steps in the grieving process. You may want to: °· Hold your baby after the birth. °· Name your baby. °· Request a birth certificate. °· Create a keepsake such as handprints or footprints. °· Dress your baby and have a picture taken. °· Make funeral arrangements. °· Ask for a baptism  or blessing. °Hospitals have staff members who can help you with all these arrangements. °How to recognize emotional stress °It is normal to have emotional stress after a pregnancy loss. But emotional stress that lasts a long time or becomes severe requires treatment. Watch out for these signs of severe emotional stress: °· Sadness, anger, or guilt that is not going away and is interfering with your normal activities. °· Relationship problems that have occurred or gotten worse since the pregnancy loss. °· Signs of depression that last longer than 2 weeks. These may include: °? Sadness. °? Anxiety. °? Hopelessness. °? Loss of interest in activities you enjoy. °? Inability to concentrate. °? Trouble sleeping or sleeping too much. °? Loss of appetite or overeating. °? Thoughts of death or of hurting yourself. °Follow these instructions at home: °· Take over-the-counter and prescription medicines only as told by your health care provider. °· Rest at home until your energy level returns. Return to your normal activities as told by your health care provider. Ask your health care provider what activities are safe for you. °· When you are ready, meet with your health care provider to discuss steps to take for a future pregnancy. °· Keep all follow-up visits as told by your health care provider. This is important. °Where to find support °· To help you and your partner with the process of grieving, talk with your health care provider or seek counseling. °· Consider meeting with others who have experienced pregnancy loss. Ask your health care provider about support groups and resources. °Where to find more information °· U.S. Department of Health and Human Services Office on Women's Health: www.womenshealth.gov °· American Pregnancy Association: www.americanpregnancy.org °Contact a health care provider if: °· You continue to experience grief, sadness, or lack of motivation for everyday activities, and those feelings do not  improve over time. °· You are struggling to recover emotionally, especially if you are using alcohol or substances to help. °Get help right away if: °· You have thoughts of hurting yourself or others. °If you ever feel like you may hurt yourself or others, or have thoughts about taking your own life, get help right away. You can go to your nearest emergency department or call: °· Your local emergency services (911 in the [place] °· A suicide crisis helpline, such as the National Suicide Prevention Lifeline at 1-800-273-8255. This is open 24 hours a day. °Summary °· Any pregnancy loss can be difficult physically and emotionally. °· You may experience many different emotions while you grieve. Emotional recovery can last longer than physical recovery. °· It is normal to have emotional stress after a pregnancy loss. But emotional stress that lasts a long time or becomes severe requires treatment. °· See your health care provider if you are struggling emotionally after a pregnancy   loss. °This information is not intended to replace advice given to you by your health care provider. Make sure you discuss any questions you have with your health care provider. °Document Released: 11/22/2017 Document Revised: 01/01/2019 Document Reviewed: 11/22/2017 °Elsevier Patient Education © 2020 Elsevier Inc. ° °

## 2019-06-18 NOTE — MAU Note (Signed)
She miscarried around the 12th. Was out of town.  Went for [place] at [employer], they were behind and had to reschedule.  Had BHcg level yesterday, they called her with result, she thought they said it was elevated.  Sent her in for [place]. No longer feels preg.  No longer bleeding, stopped approx 5 days later. No longer having any pain.

## 2019-06-19 ENCOUNTER — Telehealth: Payer: Self-pay | Admitting: *Deleted

## 2019-06-19 NOTE — Telephone Encounter (Signed)
Patient has been seen at [facility] for her miscarriage and will follow up through the hospital providers.   Cc- Sue Williams

## 2019-06-19 NOTE — Telephone Encounter (Signed)
Routing to Dr Silva. Encounter closed.  

## 2019-06-19 NOTE — Telephone Encounter (Signed)
Patient returned call. Says she does not need forms filled out and to disregard.

## 2019-06-19 NOTE — Telephone Encounter (Signed)
FMLA forms received on 06-14-19. Patient is unde care of [facility]. We are not aware of dates she has missed work.  Left message to call back regarding FMLA forms.

## 2019-06-24 ENCOUNTER — Ambulatory Visit: Payer: 59

## 2019-06-25 ENCOUNTER — Ambulatory Visit: Payer: 59

## 2019-06-25 ENCOUNTER — Other Ambulatory Visit: Payer: Self-pay

## 2019-06-25 ENCOUNTER — Other Ambulatory Visit: Payer: Self-pay | Admitting: *Deleted

## 2019-06-25 DIAGNOSIS — O039 Complete or unspecified spontaneous abortion without complication: Secondary | ICD-10-CM | POA: Diagnosis not present

## 2019-06-26 ENCOUNTER — Telehealth: Payer: Self-pay | Admitting: Women's Health

## 2019-06-26 DIAGNOSIS — O039 Complete or unspecified spontaneous abortion without complication: Secondary | ICD-10-CM

## 2019-06-26 LAB — BETA HCG QUANT (REF LAB): hCG Quant: 80 m[IU]/mL

## 2019-06-26 NOTE — Telephone Encounter (Signed)
Attempted to call pt re: hCG results. Pt did not answer, left VM. Will send MyChart message.  Clarisa Fling, NP  8:53 AM 06/26/2019

## 2019-06-26 NOTE — Telephone Encounter (Signed)
Patient stated she received a call from [place], and would like a call back.

## 2019-06-26 NOTE — Telephone Encounter (Signed)
Patient called back into front office stating she is returning our phone call. She states she was calling to set up her follow up lab appt. Patient states she can come in on the 8th at 9:45am. Told patient I will cancel her ultrasound appt on that day. Patient verbalized understanding and asked about appt on the 14th. Discussed it is for future pregnancy disucssion vs SAB. Patient states she cannot make the appt that day but will call the front office to reschedule. Patient had no questions.

## 2019-06-26 NOTE — Telephone Encounter (Signed)
Called patient, no answer- left message to call us back if you still need assistance.

## 2019-06-26 NOTE — Addendum Note (Signed)
Addended by: Shelly Coss on: 06/26/2019 01:26 PM   Modules accepted: Orders

## 2019-07-02 ENCOUNTER — Telehealth: Payer: Self-pay | Admitting: Family Medicine

## 2019-07-02 NOTE — Telephone Encounter (Signed)
Called the patient to confirm the upcoming appointment. Left a voicemail informing the patient if she’s been diagnosed with covid, in close contact with someone who has had covid, or experienced flu-like symptoms in the past 14 days please call our office to reschedule. In addition at this time due to covid restrictions no vistors or children allowed to attend with our patient. If you have any questions or concerns, please contact our office at [phone]. °

## 2019-07-03 ENCOUNTER — Other Ambulatory Visit: Payer: Self-pay

## 2019-07-03 ENCOUNTER — Ambulatory Visit (HOSPITAL_COMMUNITY): Payer: 59

## 2019-07-03 ENCOUNTER — Other Ambulatory Visit: Payer: 59

## 2019-07-03 DIAGNOSIS — O039 Complete or unspecified spontaneous abortion without complication: Secondary | ICD-10-CM | POA: Diagnosis not present

## 2019-07-04 LAB — BETA HCG QUANT (REF LAB): hCG Quant: 35 m[IU]/mL

## 2019-07-09 ENCOUNTER — Ambulatory Visit: Payer: 59 | Admitting: Medical

## 2019-07-22 ENCOUNTER — Other Ambulatory Visit: Payer: Self-pay

## 2019-07-22 ENCOUNTER — Encounter: Payer: Self-pay | Admitting: Women's Health

## 2019-07-22 ENCOUNTER — Ambulatory Visit (INDEPENDENT_AMBULATORY_CARE_PROVIDER_SITE_OTHER): Payer: 59 | Admitting: Women's Health

## 2019-07-22 VITALS — BP 114/74 | HR 76 | Temp 98.7°F | Ht 67.0 in | Wt 157.0 lb

## 2019-07-22 DIAGNOSIS — O039 Complete or unspecified spontaneous abortion without complication: Secondary | ICD-10-CM

## 2019-07-22 NOTE — Patient Instructions (Signed)
Managing Pregnancy Loss °Pregnancy loss can happen any time during a pregnancy. Often the cause is not known. It is rarely because of anything you did. Pregnancy loss in early pregnancy (during the first trimester) is called a miscarriage. This type of pregnancy loss is the most common. Pregnancy loss that happens after 20 weeks of pregnancy is called fetal demise if the baby's heart stops beating before birth. Fetal demise is much less common. Some women experience spontaneous labor shortly after fetal demise resulting in a stillborn birth (stillbirth). °Any pregnancy loss can be devastating. You will need to recover both physically and emotionally. Most women are able to get pregnant again after a pregnancy loss and deliver a healthy baby. °How to manage emotional recovery ° °Pregnancy loss is very hard emotionally. You may feel many different emotions while you grieve. You may feel sad and angry. You may also feel guilty. It is normal to have periods of crying. Emotional recovery can take longer than physical recovery. It is different for everyone. °Taking these steps can help you in managing this loss: °· Remember that it is unlikely you did anything to cause the pregnancy loss. °· Share your thoughts and feelings with friends, family, and your partner. Remember that your partner is also recovering emotionally. °· Make sure you have a good support system. Do not spend too much time alone. °· Meet with a pregnancy loss counselor or join a pregnancy loss support group. °· Get enough sleep and eat a healthy diet. Return to regular exercise when you have recovered physically. °· Do not use drugs or alcohol to manage your emotions. °· Consider seeing a mental health professional to help you recover emotionally. °· Ask a friend or loved one to help you decide what to do with any clothing and nursery items you received for your baby. °In the case of a stillbirth, many women benefit from taking additional steps in the  grieving process. You may want to: °· Hold your baby after the birth. °· Name your baby. °· Request a birth certificate. °· Create a keepsake such as handprints or footprints. °· Dress your baby and have a picture taken. °· Make funeral arrangements. °· Ask for a baptism or blessing. °Hospitals have staff members who can help you with all these arrangements. °How to recognize emotional stress °It is normal to have emotional stress after a pregnancy loss. But emotional stress that lasts a long time or becomes severe requires treatment. Watch out for these signs of severe emotional stress: °· Sadness, anger, or guilt that is not going away and is interfering with your normal activities. °· Relationship problems that have occurred or gotten worse since the pregnancy loss. °· Signs of depression that last longer than 2 weeks. These may include: °? Sadness. °? Anxiety. °? Hopelessness. °? Loss of interest in activities you enjoy. °? Inability to concentrate. °? Trouble sleeping or sleeping too much. °? Loss of appetite or overeating. °? Thoughts of death or of hurting yourself. °Follow these instructions at home: °· Take over-the-counter and prescription medicines only as told by your health care provider. °· Rest at home until your energy level returns. Return to your normal activities as told by your health care provider. Ask your health care provider what activities are safe for you. °· When you are ready, meet with your health care provider to discuss steps to take for a future pregnancy. °· Keep all follow-up visits as told by your health care provider. This is important. °  Where to find support °· To help you and your partner with the process of grieving, talk with your health care provider or seek counseling. °· Consider meeting with others who have experienced pregnancy loss. Ask your health care provider about support groups and resources. °Where to find more information °· [place] Department of Health and Human  Services Office on Women's Health: www.womenshealth.gov °· American Pregnancy Association: www.americanpregnancy.org °Contact a health care provider if: °· You continue to experience grief, sadness, or lack of motivation for everyday activities, and those feelings do not improve over time. °· You are struggling to recover emotionally, especially if you are using alcohol or substances to help. °Get help right away if: °· You have thoughts of hurting yourself or others. °If you ever feel like you may hurt yourself or others, or have thoughts about taking your own life, get help right away. You can go to your nearest emergency department or call: °· Your local emergency services (911 in the [place] °· A suicide crisis helpline, such as the National Suicide Prevention Lifeline at 1-800-273-8255. This is open 24 hours a day. °Summary °· Any pregnancy loss can be difficult physically and emotionally. °· You may experience many different emotions while you grieve. Emotional recovery can last longer than physical recovery. °· It is normal to have emotional stress after a pregnancy loss. But emotional stress that lasts a long time or becomes severe requires treatment. °· See your health care provider if you are struggling emotionally after a pregnancy loss. °This information is not intended to replace advice given to you by your health care provider. Make sure you discuss any questions you have with your health care provider. °Document Released: 11/22/2017 Document Revised: 01/01/2019 Document Reviewed: 11/22/2017 °Elsevier Patient Education © 2020 [employer] Inc. ° °

## 2019-07-22 NOTE — Progress Notes (Signed)
Pt states has started taking previous BC Pills that she had, cannot recall the name of them.

## 2019-07-22 NOTE — Progress Notes (Signed)
History:  Ms. Sue Williams is a 38 y.o. G4P0030 who presents to clinic today for SAB f/u. Pt denies bleeding or pain. Pt reports LMP 07/05/2019, lasting 4days, pt reports normal flow, maybe a little heavier than normal. Pt reports she starting OCPs she had been previously prescribed starting late September, pt satisfied with pills. Pt reports she has an annual scheduled for 09/2019 and has sufficient supply until that time.  Pt last hCG was on 07/03/2019 and was 35. No US performed since miscarriage.  The following portions of the patient's history were reviewed and updated as appropriate: allergies, current medications, family history, past medical history, social history, past surgical history and problem list.  Review of Systems:  Review of Systems  Constitutional: Negative for chills and fever.  Respiratory: Negative for shortness of breath.   Cardiovascular: Negative for chest pain.  Gastrointestinal: Negative for abdominal pain, nausea and vomiting.  Genitourinary: Negative for dysuria, frequency and urgency.  Neurological: Negative for headaches.     Objective:  Physical Exam BP 114/74   Pulse 76   Temp 98.7 F (37.1 C)   Ht 5\' 7"  (1.702 m)   Wt 157 lb (71.2 kg)   LMP 04/07/2019 Comment: SAB  Breastfeeding Unknown   BMI 24.59 kg/m  Physical Exam  Constitutional: She is oriented to person, place, and time. She appears well-developed and well-nourished. No distress.  HENT:  Head: Normocephalic and atraumatic.  Respiratory: Effort normal.  GI: Soft.  Genitourinary:    Genitourinary Comments: Pt declines pelvic exam.   Neurological: She is alert and oriented to person, place, and time.  Skin: She is not diaphoretic.  Psychiatric: She has a normal mood and affect. Her behavior is normal. Judgment and thought content normal.   Labs and Imaging No results found for this or any previous visit (from the past 24 hour(s)).  No results found.  Assessment & Plan:   1.  Miscarriage - Beta hCG quant (ref lab) - pt to f/u with provider for annual well visit in January 2021 - RTC PRN  Clarisa Fling, NP 07/22/2019 11:48 AM

## 2019-07-23 LAB — BETA HCG QUANT (REF LAB): hCG Quant: 13 m[IU]/mL

## 2019-10-16 DIAGNOSIS — M25552 Pain in left hip: Secondary | ICD-10-CM | POA: Insufficient documentation

## 2019-12-09 ENCOUNTER — Encounter: Payer: Self-pay | Admitting: Certified Nurse Midwife

## 2019-12-09 ENCOUNTER — Other Ambulatory Visit: Payer: Self-pay

## 2019-12-09 ENCOUNTER — Ambulatory Visit (INDEPENDENT_AMBULATORY_CARE_PROVIDER_SITE_OTHER): Payer: 59 | Admitting: Certified Nurse Midwife

## 2019-12-09 ENCOUNTER — Other Ambulatory Visit: Payer: Self-pay | Admitting: Certified Nurse Midwife

## 2019-12-09 VITALS — BP 118/80 | HR 70 | Temp 98.2°F | Resp 16 | Ht 66.75 in | Wt 156.0 lb

## 2019-12-09 DIAGNOSIS — Z0000 Encounter for general adult medical examination without abnormal findings: Secondary | ICD-10-CM

## 2019-12-09 DIAGNOSIS — E559 Vitamin D deficiency, unspecified: Secondary | ICD-10-CM | POA: Diagnosis not present

## 2019-12-09 DIAGNOSIS — Z01419 Encounter for gynecological examination (general) (routine) without abnormal findings: Secondary | ICD-10-CM | POA: Diagnosis not present

## 2019-12-09 DIAGNOSIS — Z3041 Encounter for surveillance of contraceptive pills: Secondary | ICD-10-CM | POA: Diagnosis not present

## 2019-12-09 MED ORDER — NORETHINDRONE 0.35 MG PO TABS: ORAL_TABLET | ORAL | 12 refills | Status: DC

## 2019-12-09 NOTE — Patient Instructions (Signed)

## 2019-12-09 NOTE — Progress Notes (Signed)
39 y.o. G4P0030 Single  Caucasian Fe here for annual exam. Periods normal, no missed periods no cramping. Working different jobs and now ready to get outside and walk. Contraception Micronor working well. Has taken Covid vaccine and no issues. No STD concerns or partner change. No other health issues today.  Patient's last menstrual period was 04/07/2019.          Sexually active: Yes.    The current method of family planning is oral progesterone-only contraceptive.    Exercising: Yes.    walking Smoker:  no  Review of Systems  Constitutional: Negative.   HENT: Negative.   Eyes: Negative.   Respiratory: Negative.   Cardiovascular: Negative.   Gastrointestinal: Negative.   Genitourinary: Negative.   Musculoskeletal: Negative.   Skin: Negative.   Neurological: Negative.   Endo/Heme/Allergies: Negative.   Psychiatric/Behavioral: Negative.     Health Maintenance: Pap:  10-07-15 neg, 12-05-2018 neg HPV HR neg History of Abnormal Pap: no MMG:  none Self Breast exams: yes Colonoscopy:  none BMD:   none TDaP:  2018 Shingles: no Pneumonia: no Hep C and HIV: both neg 2018 Labs: yes   reports that she has quit smoking. Her smoking use included cigarettes. She has never used smokeless tobacco. She reports current alcohol use. She reports that she does not use drugs.  Past Medical History:  Diagnosis Date  . Sebaceous cyst of skin of left breast    around areola    Past Surgical History:  Procedure Laterality Date  . BREAST CYST EXCISION Left 12/10/2014   Procedure: CYST EXCISION LEFT BREAST AEREOLA;  Surgeon: Stark Klein, MD;  Location: [facility];  Service: General;  Laterality: Left;  . NASAL SINUS SURGERY  2008  . TONSILLECTOMY    . WISDOM TOOTH EXTRACTION      No current outpatient medications on file.   No current facility-administered medications for this visit.    Family History  Problem Relation Age of Onset  . Heart disease Father   . Diabetes  Maternal Grandmother   . Heart disease Maternal Grandfather     ROS:  Pertinent items are noted in HPI.  Otherwise, a comprehensive ROS was negative.  Exam:   LMP 04/07/2019    Ht Readings from Last 3 Encounters:  07/22/19 5\' 7"  (1.702 m)  06/18/19 5' 6.75" (1.695 m)  05/29/19 5' 6.75" (1.695 m)    General appearance: alert, cooperative and appears stated age Head: Normocephalic, without obvious abnormality, atraumatic Neck: no adenopathy, supple, symmetrical, trachea midline and thyroid normal to inspection and palpation Lungs: clear to auscultation bilaterally Breasts: normal appearance, no masses or tenderness, No nipple retraction or dimpling, No nipple discharge or bleeding, No axillary or supraclavicular adenopathy Heart: regular rate and rhythm Abdomen: soft, non-tender; no masses,  no organomegaly Extremities: extremities normal, atraumatic, no cyanosis or edema Skin: Skin color, texture, turgor normal. No rashes or lesions Lymph nodes: Cervical, supraclavicular, and axillary nodes normal. No abnormal inguinal nodes palpated Neurologic: Grossly normal   Pelvic: External genitalia:  no lesions              Urethra:  normal appearing urethra with no masses, tenderness or lesions              Bartholin's and Skene's: normal                 Vagina: normal appearing vagina with normal color and discharge, no lesions  Cervix: no cervical motion tenderness, no lesions and normal appearance              Pap taken: No. Bimanual Exam:  Uterus:  normal size, contour, position, consistency, mobility, non-tender and anteverted              Adnexa: normal adnexa and no mass, fullness, tenderness               Rectovaginal: Confirms               Anus:  normal sphincter tone, no lesions  Chaperone present: yes  A:  Well Woman with normal exam  Contraception progesterone only  Mammogram screening at 39  Screening labs  P:   Reviewed health and wellness pertinent to  exam  Risks/benefits/warning signs reviewed and need to advise.  Rx Norlyda see order with instructions  Given information on screening facility and encouraged to schedule after her birthday  Labs: CBC,CMP, lipid panel, TSH, Vitamin D  Pap smear: no   counseled on breast self exam, mammography screening, feminine hygiene, use and side effects of OCP's, adequate intake of calcium and vitamin D, diet and exercise  return annually or prn  An After Visit Summary was printed and given to the patient.

## 2019-12-10 LAB — COMPREHENSIVE METABOLIC PANEL
ALT: 12 IU/L (ref 0–32)
AST: 14 IU/L (ref 0–40)
Albumin/Globulin Ratio: 2.3 — ABNORMAL HIGH (ref 1.2–2.2)
Albumin: 4.4 g/dL (ref 3.8–4.8)
Alkaline Phosphatase: 60 IU/L (ref 39–117)
BUN/Creatinine Ratio: 22 (ref 9–23)
BUN: 14 mg/dL (ref 6–20)
Bilirubin Total: 0.6 mg/dL (ref 0.0–1.2)
CO2: 23 mmol/L (ref 20–29)
Calcium: 9.2 mg/dL (ref 8.7–10.2)
Chloride: 104 mmol/L (ref 96–106)
Creatinine, Ser: 0.63 mg/dL (ref 0.57–1.00)
GFR calc Af Amer: 131 mL/min/{1.73_m2} (ref >59)
GFR calc non Af Amer: 113 mL/min/{1.73_m2} (ref >59)
Globulin, Total: 1.9 g/dL (ref 1.5–4.5)
Glucose: 81 mg/dL (ref 65–99)
Potassium: 4.2 mmol/L (ref 3.5–5.2)
Sodium: 140 mmol/L (ref 134–144)
Total Protein: 6.3 g/dL (ref 6.0–8.5)

## 2019-12-10 LAB — LIPID PANEL
Chol/HDL Ratio: 3.1 ratio (ref 0.0–4.4)
Cholesterol, Total: 183 mg/dL (ref 100–199)
HDL: 59 mg/dL (ref >39)
LDL Chol Calc (NIH): 106 mg/dL — ABNORMAL HIGH (ref 0–99)
Triglycerides: 99 mg/dL (ref 0–149)
VLDL Cholesterol Cal: 18 mg/dL (ref 5–40)

## 2019-12-10 LAB — CBC
Hematocrit: 42.7 % (ref 34.0–46.6)
Hemoglobin: 14.3 g/dL (ref 11.1–15.9)
MCH: 31 pg (ref 26.6–33.0)
MCHC: 33.5 g/dL (ref 31.5–35.7)
MCV: 92 fL (ref 79–97)
Platelets: 346 10*3/uL (ref 150–450)
RBC: 4.62 x10E6/uL (ref 3.77–5.28)
RDW: 12.8 % (ref 11.7–15.4)
WBC: 11.6 10*3/uL — ABNORMAL HIGH (ref 3.4–10.8)

## 2019-12-10 LAB — TSH: TSH: 1.64 u[IU]/mL (ref 0.450–4.500)

## 2019-12-10 LAB — VITAMIN D 25 HYDROXY (VIT D DEFICIENCY, FRACTURES): Vit D, 25-Hydroxy: 67.9 ng/mL (ref 30.0–100.0)

## 2019-12-11 ENCOUNTER — Other Ambulatory Visit: Payer: Self-pay

## 2019-12-11 DIAGNOSIS — D72829 Elevated white blood cell count, unspecified: Secondary | ICD-10-CM

## 2019-12-11 NOTE — Progress Notes (Signed)
Repeat CBC for elevated WBC placed per [facility] lab results on 12/11/2019.

## 2019-12-12 ENCOUNTER — Encounter: Payer: Self-pay | Admitting: Certified Nurse Midwife

## 2019-12-17 ENCOUNTER — Other Ambulatory Visit: Payer: Self-pay

## 2019-12-17 ENCOUNTER — Other Ambulatory Visit (INDEPENDENT_AMBULATORY_CARE_PROVIDER_SITE_OTHER): Payer: 59

## 2019-12-17 DIAGNOSIS — D72829 Elevated white blood cell count, unspecified: Secondary | ICD-10-CM | POA: Diagnosis not present

## 2019-12-18 LAB — CBC
Hematocrit: 40.8 % (ref 34.0–46.6)
Hemoglobin: 13.5 g/dL (ref 11.1–15.9)
MCH: 31.3 pg (ref 26.6–33.0)
MCHC: 33.1 g/dL (ref 31.5–35.7)
MCV: 94 fL (ref 79–97)
Platelets: 344 10*3/uL (ref 150–450)
RBC: 4.32 x10E6/uL (ref 3.77–5.28)
RDW: 12.7 % (ref 11.7–15.4)
WBC: 10.2 10*3/uL (ref 3.4–10.8)

## 2020-05-05 DIAGNOSIS — H5213 Myopia, bilateral: Secondary | ICD-10-CM | POA: Diagnosis not present

## 2020-06-10 DIAGNOSIS — N760 Acute vaginitis: Secondary | ICD-10-CM | POA: Diagnosis not present

## 2020-06-18 ENCOUNTER — Telehealth: Payer: Self-pay | Admitting: Obstetrics & Gynecology

## 2020-06-18 ENCOUNTER — Other Ambulatory Visit: Payer: Self-pay

## 2020-06-18 ENCOUNTER — Encounter: Payer: Self-pay | Admitting: Obstetrics & Gynecology

## 2020-06-18 ENCOUNTER — Ambulatory Visit: Payer: 59 | Admitting: Obstetrics & Gynecology

## 2020-06-18 ENCOUNTER — Telehealth: Payer: Self-pay

## 2020-06-18 VITALS — BP 104/68 | HR 68 | Temp 98.1°F | Resp 16 | Wt 151.0 lb

## 2020-06-18 DIAGNOSIS — R3915 Urgency of urination: Secondary | ICD-10-CM | POA: Diagnosis not present

## 2020-06-18 DIAGNOSIS — N390 Urinary tract infection, site not specified: Secondary | ICD-10-CM | POA: Diagnosis not present

## 2020-06-18 LAB — POCT URINALYSIS DIPSTICK
Bilirubin, UA: NEGATIVE
Glucose, UA: NEGATIVE
Ketones, UA: NEGATIVE
Leukocytes, UA: NEGATIVE
Nitrite, UA: NEGATIVE
Protein, UA: NEGATIVE
Urobilinogen, UA: NEGATIVE E.U./dL — AB
pH, UA: 5 (ref 5.0–8.0)

## 2020-06-18 MED ORDER — MIRABEGRON ER 25 MG PO TB24: 25.0000 mg | ORAL_TABLET | Freq: Every day | ORAL | 1 refills | Status: DC

## 2020-06-18 NOTE — Telephone Encounter (Signed)
Spoke with patient. Out of pocket cost for Myrbetriq 25 mg tab $124 for 30 day supply.  Reviewed manufacture savings card as an option.  [email or web address]://www.[employer].[email or web address]  Patient is going to try savings coupon first. She will contact the office if she needs any additional assistance or has additional questions.   Routing to provider for final review. Patient is agreeable to disposition. Will close encounter.

## 2020-06-18 NOTE — Telephone Encounter (Signed)
AEX 11/2014- DL On POPs for contraception LMP 06/14/20 AEX 11/2020 with Maryan Char with pt. Pt states having urinary urgency, frequency, back pain x 1-2 weeks. Pt was seen at [facility] on 06/11/20 and was told neg testing, but was given Bactrim Rx. Pt states sx have not resolved and "feels like something is wrong". Denies fever, chills, abd cramps, vaginal bleeding.  Pt advised to have OV for further evaluation. Pt agreeable. Pt scheduled today with Dr Hyacinth Meeker at 930 am. Pt verbalized understanding to date and time of appt.  Encounter closed.

## 2020-06-18 NOTE — Telephone Encounter (Signed)
Patient is calling in regards to possible UTI.

## 2020-06-18 NOTE — Telephone Encounter (Signed)
Patient would like to speak with nurse regarding prescription and cost at pharmacy.

## 2020-06-18 NOTE — Progress Notes (Signed)
GYNECOLOGY  VISIT  CC:   Urinary urgency/frequency  HPI: 39 y.o. G76P0030 Single White or Caucasian female here for possible UTI.  Reports symptoms started about a week ago and seem to be gradually worsening. She has increased symptoms of urinary urgency and urinary frequency.  Got up to void eight times last night while trying to sleep.  Reports she feels like her bladder is in her vagina.  She does not have any risk factors for prolapse.  Reports she had some lower back pain earlier in the week but cycle started and this resolved.  bleedind is typically 3-4 days and she's basically done with her cycle now.  Denies blood in her urine.  No h/o stones.  She does drink about 2 cups of coffee a day but this is not new.  Went to [facility] earlier in the week.  This reviewed in Epic.  Urine culture negative.  Vaginitis testing for GC/chl/trich negative.  BV/yeast testing pending.  No blood in urine  Reviewed results with pt.  .    Currently SA.  Has been together for years.  Does not have STD concerns.  Works in cath lab.  Former smoker.   poct urine-neg  GYNECOLOGIC HISTORY: Patient's last menstrual period was 06/14/2020 (exact date). Contraception: norlyda Menopausal hormone therapy: none  Patient Active Problem List   Diagnosis Date Noted  . Pain of left hip joint 10/16/2019    Past Medical History:  Diagnosis Date  . Sebaceous cyst of skin of left breast    around areola    Past Surgical History:  Procedure Laterality Date  . BREAST CYST EXCISION Left 12/10/2014   Procedure: CYST EXCISION LEFT BREAST AEREOLA;  Surgeon: Almond Lint, MD;  Location: [facility];  Service: General;  Laterality: Left;  . NASAL SINUS SURGERY  2008  . TONSILLECTOMY    . WISDOM TOOTH EXTRACTION      MEDS:   Current Outpatient Medications on File Prior to Visit  Medication Sig Dispense Refill  . B Complex Vitamins (B COMPLEX PO) Take by mouth.    . norethindrone (NORLYDA) 0.35 MG tablet  Norlyda 0.35 mg tablet 1 Package 12  . sulfamethoxazole-trimethoprim (BACTRIM DS) 800-160 MG tablet Take by mouth.    . Vitamins/Minerals TABS Take by mouth.     No current facility-administered medications on file prior to visit.    ALLERGIES: Codeine and Demerol [meperidine]  Family History  Problem Relation Age of Onset  . Heart disease Father   . Diabetes Maternal Grandmother   . Heart disease Maternal Grandfather     SH:  Single but in long term relationship, former smoker  Review of Systems  Constitutional: Negative.   HENT: Negative.   Eyes: Negative.   Respiratory: Negative.   Cardiovascular: Negative.   Gastrointestinal: Negative.   Endocrine: Negative.   Genitourinary: Positive for frequency.  Musculoskeletal: Negative.   Skin: Negative.   Allergic/Immunologic: Negative.   Neurological: Negative.   Hematological: Negative.   Psychiatric/Behavioral: Negative.     PHYSICAL EXAMINATION:    BP 104/68   Pulse 68   Temp 98.1 F (36.7 C) (Skin)   Resp 16   Wt 151 lb (68.5 kg)   LMP 06/14/2020 (Exact Date)   BMI 23.83 kg/m     General appearance: alert, cooperative and appears stated age Flank:  No CVA tenderness Abdomen: soft, non-tender; bowel sounds normal; no masses,  no organomegaly Lymph:  no inguinal LAD noted  Pelvic: External genitalia:  no lesions              Urethra:  normal appearing urethra with no masses, tenderness or lesions              Bartholins and Skenes: normal                 Vagina: normal appearing vagina with normal color and discharge, no lesions              Cervix: no lesions              Bimanual Exam:  Uterus:  normal size, contour, position, consistency, mobility, non-tender              Adnexa: no mass, fullness, tenderness  Chaperone, Zenovia Jordan, CMA, was present for exam.  Assessment: OAB? Urinary urgency and frequency with negative [facility] as well as negative GC/Chl/trich testing.       Plan: With exam completely normal and pending vaginitis testing from [facility], repeat urine culture obtained.  Feel this will be negative.  Started pt on Myrbetriq 25mg  daily.  Can increase in a few days to 50mg  if needed.  Pt will check BP in 2-3 weeks.  Side effects discussed.  Do not see any other source of urinary urgency/frequency other than OAB BV/yeast testing pending from [facility] pending.  Asked pt to send mychart if gets results.   Repeat culture obtained here due to fairly acute onset of symptoms.   Pt and I discussed urology consultation as well if symptoms do not improve and culture is negative.  She would be comfortable with this.

## 2020-06-21 ENCOUNTER — Other Ambulatory Visit: Payer: Self-pay | Admitting: Obstetrics & Gynecology

## 2020-06-21 DIAGNOSIS — N3281 Overactive bladder: Secondary | ICD-10-CM

## 2020-06-21 DIAGNOSIS — R3915 Urgency of urination: Secondary | ICD-10-CM

## 2020-06-21 LAB — URINE CULTURE

## 2020-06-25 ENCOUNTER — Other Ambulatory Visit (HOSPITAL_COMMUNITY): Payer: Self-pay | Admitting: Family Medicine

## 2020-06-25 DIAGNOSIS — R3129 Other microscopic hematuria: Secondary | ICD-10-CM | POA: Diagnosis not present

## 2020-06-25 DIAGNOSIS — R350 Frequency of micturition: Secondary | ICD-10-CM | POA: Diagnosis not present

## 2020-06-25 DIAGNOSIS — R351 Nocturia: Secondary | ICD-10-CM | POA: Diagnosis not present

## 2020-06-25 DIAGNOSIS — N390 Urinary tract infection, site not specified: Secondary | ICD-10-CM | POA: Diagnosis not present

## 2020-06-25 DIAGNOSIS — N201 Calculus of ureter: Secondary | ICD-10-CM | POA: Diagnosis not present

## 2020-06-25 DIAGNOSIS — B954 Other streptococcus as the cause of diseases classified elsewhere: Secondary | ICD-10-CM | POA: Diagnosis not present

## 2020-07-05 NOTE — Progress Notes (Signed)
New Patient Note  RE: Sue Williams MRN: [id number] DOB: 1981-03-11 Date of Office Visit: 07/06/2020  Referring provider: No ref. provider found Primary care provider: Romualdo Bolk, MD  Chief Complaint: Rash  History of Present Illness: I had the pleasure of seeing Sue Williams for initial evaluation at the [facility] on 07/06/2020. She is a 39 y.o. female, who is self-referred here for the evaluation of rash.  Symptoms started about 4-5 weeks ago. Mainly occurs on her posterior upper thigh and upper arms. Describes them as burny, itchy, flat erythematous patches. Symptoms lasts about 3-4 hours. This usually occurs a few times per week. This can happen anytime during the day or night. Associated symptoms include: none. Suspected triggers are unknown. Denies any fevers, chills, changes in medications, foods, personal care products or recent infections. She did have a UTI but she had symptoms before the infection.  She has tried the following therapies: benadryl with minimal benefit. Systemic steroids none.  Previous work up includes: none. Previous history of rash/hives: no. Patient is up to date with the following cancer screening tests: physical exam, pap smears. Needs mammogram.   Assessment and Plan: Sue Williams is a 39 y.o. female with: Rash and other nonspecific skin eruption Pruritic, burning rash started 4-5 weeks ago. Mainly occurs on posterior upper thigh and upper arms lasting 3-4 hours a few times per week. No triggers noted. Tried benadryl with minimal benefit. Denies any fevers, chills, changes in medications, foods, personal care products. Concerned if she is developing allergies.  Discussed with patient that given above clinical history I doubt this is triggered by any food or environmental allergies. More concerned about contact irritation but not sure what's causing these symptoms at this time.  Keep track of symptoms and take pictures  when it flares.  Write down what you came across when the symptoms occur and see if you notice a trend.  Meanwhile start proper skin care as below.  Take zyrtec (cetirizine) 10mg  daily at night to see if it helps with the symptoms.  If no improvement in 2 months then will discuss getting boodwork and/or doing skin testing at that time.  Return in about 2 months (around 09/05/2020).  Other allergy screening: Asthma: no Rhino conjunctivitis: no Food allergy: no Medication allergy: no Hymenoptera allergy: no  Large localized reactions.  History of recurrent infections suggestive of immunodeficency: no  Diagnostics: Skin Testing: None.  Past Medical History: Patient Active Problem List   Diagnosis Date Noted  . Rash and other nonspecific skin eruption 07/06/2020  . Pruritus 07/06/2020  . Pain of left hip joint 10/16/2019   Past Medical History:  Diagnosis Date  . Sebaceous cyst of skin of left breast    around areola   Past Surgical History: Past Surgical History:  Procedure Laterality Date  . BREAST CYST EXCISION Left 12/10/2014   Procedure: CYST EXCISION LEFT BREAST AEREOLA;  Surgeon: 12/12/2014, MD;  Location: [facility];  Service: General;  Laterality: Left;  . NASAL SINUS SURGERY  2008  . TONSILLECTOMY    . WISDOM TOOTH EXTRACTION     Medication List:  Current Outpatient Medications  Medication Sig Dispense Refill  . norethindrone (NORLYDA) 0.35 MG tablet Norlyda 0.35 mg tablet 1 Package 12  . oxybutynin (DITROPAN-XL) 10 MG 24 hr tablet Take 10 mg by mouth daily.     No current facility-administered medications for this visit.   Allergies: Allergies  Allergen Reactions  .  Codeine Nausea And Vomiting  . Demerol [Meperidine] Nausea And Vomiting   Social History: Social History   Socioeconomic History  . Marital status: Single    Spouse name: Not on file  . Number of children: Not on file  . Years of education: Not on file  . Highest  education level: Not on file  Occupational History  . Not on file  Tobacco Use  . Smoking status: Former Smoker    Types: Cigarettes  . Smokeless tobacco: Never Used  . Tobacco comment: occ  Vaping Use  . Vaping Use: Never used  Substance and Sexual Activity  . Alcohol use: Not Currently  . Drug use: No  . Sexual activity: Yes    Partners: Male    Birth control/protection: Pill  Other Topics Concern  . Not on file  Social History Narrative  . Not on file   Social Determinants of Health   Financial Resource Strain:   . Difficulty of Paying Living Expenses: Not on file  Food Insecurity:   . Worried About Programme researcher, broadcasting/film/video in the Last Year: Not on file  . Ran Out of Food in the Last Year: Not on file  Transportation Needs:   . Lack of Transportation (Medical): Not on file  . Lack of Transportation (Non-Medical): Not on file  Physical Activity:   . Days of Exercise per Week: Not on file  . Minutes of Exercise per Session: Not on file  Stress:   . Feeling of Stress : Not on file  Social Connections:   . Frequency of Communication with Friends and Family: Not on file  . Frequency of Social Gatherings with Friends and Family: Not on file  . Attends Religious Services: Not on file  . Active Member of Clubs or Organizations: Not on file  . Attends Banker Meetings: Not on file  . Marital Status: Not on file   Lives in a [address] townhome. Smoking: denies Occupation: EP Ambulance person HistorySurveyor, minerals in the house: no Carpet in the family room: no Carpet in the bedroom: no Heating: gas Cooling: central Pet: no  Family History: Family History  Problem Relation Age of Onset  . Heart disease Father   . Diabetes Maternal Grandmother   . Heart disease Maternal Grandfather    Problem                               Relation Asthma                                   No  Eczema                                No  Food allergy                           No  Allergic rhino conjunctivitis     No   Review of Systems  Constitutional: Negative for appetite change, chills, fever and unexpected weight change.  HENT: Negative for congestion and rhinorrhea.   Eyes: Negative for itching.  Respiratory: Negative for cough, chest tightness, shortness of breath and wheezing.   Cardiovascular: Negative for chest pain.  Gastrointestinal: Negative for abdominal pain.  Genitourinary: Negative for difficulty urinating.  Skin: Positive for rash.  Neurological: Negative for headaches.   Objective: BP (!) 90/50   Pulse 66   Temp 98 F (36.7 C) (Temporal)   Resp 16   Ht 5' 7.5" (1.715 m)   Wt 149 lb (67.6 kg)   LMP 06/14/2020 (Exact Date)   SpO2 96%   BMI 22.99 kg/m  Body mass index is 22.99 kg/m. Physical Exam Vitals and nursing note reviewed.  Constitutional:      Appearance: Normal appearance. She is well-developed.  HENT:     Head: Normocephalic and atraumatic.     Right Ear: External ear normal.     Left Ear: External ear normal.     Nose: Nose normal.     Mouth/Throat:     Mouth: Mucous membranes are moist.     Pharynx: Oropharynx is clear.  Eyes:     Conjunctiva/sclera: Conjunctivae normal.  Cardiovascular:     Rate and Rhythm: Normal rate and regular rhythm.     Heart sounds: Normal heart sounds. No murmur heard.  No friction rub. No gallop.   Pulmonary:     Effort: Pulmonary effort is normal.     Breath sounds: Normal breath sounds. No wheezing, rhonchi or rales.  Abdominal:     Palpations: Abdomen is soft.  Musculoskeletal:     Cervical back: Neck supple.  Skin:    General: Skin is warm.     Findings: No rash.  Neurological:     Mental Status: She is alert and oriented to person, place, and time.  Psychiatric:        Behavior: Behavior normal.    The plan was reviewed with the patient/family, and all questions/concerned were addressed.  It was my pleasure to see Sue Williams today and participate in her care.  Please feel free to contact me with any questions or concerns.  Sincerely,  Wyline Mood, DO Allergy & Immunology  [facility] office: [phone] [facility] office: [phone]

## 2020-07-06 ENCOUNTER — Ambulatory Visit: Payer: 59 | Admitting: Allergy

## 2020-07-06 ENCOUNTER — Encounter: Payer: Self-pay | Admitting: Allergy

## 2020-07-06 ENCOUNTER — Other Ambulatory Visit: Payer: Self-pay

## 2020-07-06 VITALS — BP 90/50 | HR 66 | Temp 98.0°F | Resp 16 | Ht 67.5 in | Wt 149.0 lb

## 2020-07-06 DIAGNOSIS — L299 Pruritus, unspecified: Secondary | ICD-10-CM | POA: Diagnosis not present

## 2020-07-06 DIAGNOSIS — R21 Rash and other nonspecific skin eruption: Secondary | ICD-10-CM | POA: Diagnosis not present

## 2020-07-06 NOTE — Patient Instructions (Addendum)
Rash:  Not sure what's causing your rash/itching/burning.   Keep track of symptoms and take pictures when it flares.  Write down what you came across when the symptoms occur and see if you notice a trend.  Meanwhile start proper skin care as below.  Take zyrtec (cetirizine) 10mg  daily at night to see if it helps with the symptoms.  If no improvement in 2 months then will discuss getting boodwork and/or doing skin testing at that time.  Follow up in 2 months or sooner if needed.    Skin care recommendations  Bath time: . Always use lukewarm water. AVOID very hot or cold water. Keep bathing time to 5-10 minutes. . Do NOT use bubble bath. . Use a mild soap and use just enough to wash the dirty areas. . Do NOT scrub skin vigorously.  . After bathing, pat dry your skin with a towel. Do NOT rub or scrub the skin.  Moisturizers and prescriptions:  . ALWAYS apply moisturizers immediately after bathing (within 3 minutes). This helps to lock-in moisture. . Use the moisturizer several times a day over the whole body. Marland Kitchen summer moisturizers include: Aveeno, CeraVe, Cetaphil. Peri Jefferson winter moisturizers include: Aquaphor, Vaseline, Cerave, Cetaphil, Eucerin, Vanicream. . When using moisturizers along with medications, the moisturizer should be applied about one hour after applying the medication to prevent diluting effect of the medication or moisturize around where you applied the medications. When not using medications, the moisturizer can be continued twice daily as maintenance.  Laundry and clothing: . Avoid laundry products with added color or perfumes. . Use unscented hypo-allergenic laundry products such as Tide free, Cheer free & gentle, and All free and clear.  . If the skin still seems dry or sensitive, you can try double-rinsing the clothes. . Avoid tight or scratchy clothing such as wool. . Do not use fabric softeners or dyer sheets.

## 2020-07-06 NOTE — Assessment & Plan Note (Signed)
Pruritic, burning rash started 4-5 weeks ago. Mainly occurs on posterior upper thigh and upper arms lasting 3-4 hours a few times per week. No triggers noted. Tried benadryl with minimal benefit. Denies any fevers, chills, changes in medications, foods, personal care products. Concerned if she is developing allergies.  Discussed with patient that given above clinical history I doubt this is triggered by any food or environmental allergies. More concerned about contact irritation but not sure what's causing these symptoms at this time.  Keep track of symptoms and take pictures when it flares.  Write down what you came across when the symptoms occur and see if you notice a trend.  Meanwhile start proper skin care as below.  Take zyrtec (cetirizine) 10mg  daily at night to see if it helps with the symptoms.  If no improvement in 2 months then will discuss getting boodwork and/or doing skin testing at that time.

## 2020-07-15 DIAGNOSIS — R350 Frequency of micturition: Secondary | ICD-10-CM | POA: Diagnosis not present

## 2020-07-15 DIAGNOSIS — R351 Nocturia: Secondary | ICD-10-CM | POA: Diagnosis not present

## 2020-07-20 ENCOUNTER — Telehealth: Payer: Self-pay

## 2020-07-20 NOTE — Telephone Encounter (Signed)
Patient is having burning and discomfort.

## 2020-07-20 NOTE — Telephone Encounter (Signed)
Yes, that is fine. Thanks. 

## 2020-07-20 NOTE — Telephone Encounter (Signed)
Spoke with patient. Patient reports intermittent burning sensation with redness on her buttocks and vulva, symptoms started 1.5 months ago. Denies any other GYN symptoms. No new products. Has tried OTC benadryl, no change. Has seen dermatology with no resolve. She was last seen in the office on 06/18/20 for urinary symptoms, reports she f/u with urology, urinary symptoms have improved. Patient is requesting an OV on 10/28 with Dr. Hyacinth Meeker, if possible.   OV scheduled for 10/28 at 8am with Dr. Hyacinth Meeker. Advised I will review with Dr. Hyacinth Meeker and return call if any additional recommendations. Patient agreeable.    Dr. Hyacinth Meeker -I have scheduled this patient for 8am on 10/28, ok to proceed as scheduled?

## 2020-07-20 NOTE — Progress Notes (Signed)
GYNECOLOGY  VISIT  CC:   Vulvar rash  HPI: 39 y.o. [id number] Single White or Caucasian female here for possible rash on buttocks and vulva.  Pt has noticed a burning sensation that started about two days ago.  Actually not sure if there is a rash.  Wonders if she has yeast vaginitis.  Burnin sensation does seem to go over buttocks on one side and down the buttocks.  Burning feels the same as her vulvar irritation/burning sensation.  Separately, pt was seen by Dr. Sherron Monday.  CT scan was done and didn't show a stone.  She was switched from myrbetriq and oxybutinin.  She did have a positive urine culture as well.  Urine culture was negative here on 06/18/2020.  She was treated with Bactrim, then Cipro and then another antibiotic for a week.  She finished the last antibiotic a little over a week ago.  Bladder symptoms are so much better.  She is having a little constipation with this but this is handled with a little stool softener.    GYNECOLOGIC HISTORY: Patient's last menstrual period was 07/08/2020 (exact date). Contraception: norlyda Menopausal hormone therapy: none  Patient Active Problem List   Diagnosis Date Noted  . Rash and other nonspecific skin eruption 07/06/2020  . Pruritus 07/06/2020  . Pain of left hip joint 10/16/2019    Past Medical History:  Diagnosis Date  . Sebaceous cyst of skin of left breast    around areola    Past Surgical History:  Procedure Laterality Date  . BREAST CYST EXCISION Left 12/10/2014   Procedure: CYST EXCISION LEFT BREAST AEREOLA;  Surgeon: Almond Lint, MD;  Location: [facility];  Service: General;  Laterality: Left;  . NASAL SINUS SURGERY  2008  . TONSILLECTOMY    . WISDOM TOOTH EXTRACTION      MEDS:   Current Outpatient Medications on File Prior to Visit  Medication Sig Dispense Refill  . norethindrone (NORLYDA) 0.35 MG tablet Norlyda 0.35 mg tablet 1 Package 12  . oxybutynin (DITROPAN-XL) 10 MG 24 hr tablet Take 10 mg by  mouth daily.     No current facility-administered medications on file prior to visit.    ALLERGIES: Codeine and Demerol [meperidine]  Family History  Problem Relation Age of Onset  . Heart disease Father   . Diabetes Maternal Grandmother   . Heart disease Maternal Grandfather     SH:  Single, non smoker  Review of Systems  Constitutional: Negative.   HENT: Negative.   Eyes: Negative.   Respiratory: Negative.   Cardiovascular: Negative.   Gastrointestinal: Negative.   Endocrine: Negative.   Genitourinary:       Rash on buttocks & vulva  Musculoskeletal: Negative.   Skin: Negative.   Allergic/Immunologic: Negative.   Neurological: Negative.   Hematological: Negative.   Psychiatric/Behavioral: Negative.     PHYSICAL EXAMINATION:    BP 108/64   Pulse 68   Resp 16   Wt 151 lb 9.6 oz (68.8 kg)   LMP 07/08/2020 (Exact Date)   BMI 23.39 kg/m     General appearance: alert, cooperative and appears stated age Lymph:  no inguinal LAD noted  Pelvic: External genitalia:  no lesions and no rash present              Urethra:  normal appearing urethra with no masses, tenderness or lesions              Bartholins and Skenes: normal  Vagina: normal appearing vagina with whitish discharge present, no lesions              Cervix: no lesions              Bimanual Exam:  Uterus:  normal size, contour, position, consistency, mobility, non-tender              Adnexa: no mass, fullness, tenderness  Chaperone, Delton Coombes, CMA, was present for exam.  Assessment: Vulvar burning/buttocks burning without any skin changes Vaginal discharge Recent treatment with several antibiotics for UTI/urinary urgency (symptoms are better now)  Plan: Affirm pending.   Rx for diflucan 150mg  po x 1 repeat 72 hours to pharmacy. Will start amitriptyline 10mg  nightly for next 7 days and see if this helps. Pt advised to watch for any skin changes as her skin sensation is concerning for  HSV but she has no rash.  Will start Valtrex 1000mg  tid x 7 days.

## 2020-07-21 NOTE — Telephone Encounter (Signed)
Encounter closed

## 2020-07-22 ENCOUNTER — Encounter: Payer: Self-pay | Admitting: Obstetrics & Gynecology

## 2020-07-22 ENCOUNTER — Other Ambulatory Visit: Payer: Self-pay

## 2020-07-22 ENCOUNTER — Ambulatory Visit: Payer: 59 | Admitting: Obstetrics & Gynecology

## 2020-07-22 VITALS — BP 108/64 | HR 68 | Resp 16 | Wt 151.6 lb

## 2020-07-22 DIAGNOSIS — R102 Pelvic and perineal pain: Secondary | ICD-10-CM | POA: Diagnosis not present

## 2020-07-22 MED ORDER — VALACYCLOVIR HCL 1 G PO TABS: 1000.0000 mg | ORAL_TABLET | Freq: Three times a day (TID) | ORAL | 0 refills | Status: DC

## 2020-07-22 MED ORDER — TRIAMCINOLONE ACETONIDE 0.5 % EX OINT: 1.0000 "application " | TOPICAL_OINTMENT | Freq: Two times a day (BID) | CUTANEOUS | 0 refills | Status: DC

## 2020-07-22 MED ORDER — AMITRIPTYLINE HCL 10 MG PO TABS: 10.0000 mg | ORAL_TABLET | Freq: Every day | ORAL | 0 refills | Status: DC

## 2020-07-22 MED ORDER — FLUCONAZOLE 150 MG PO TABS: ORAL_TABLET | ORAL | 0 refills | Status: DC

## 2020-07-22 MED FILL — OXYBUTYNIN CL ER 10 MG TAB: 10 | 30 days supply | Qty: 30 | Fill #0

## 2020-07-22 MED FILL — NORLYDA 0.35 MG TABS: 0.35 | 84 days supply | Qty: 84 | Fill #0

## 2020-07-23 LAB — VAGINITIS/VAGINOSIS, DNA PROBE
Candida Species: NEGATIVE
Gardnerella vaginalis: NEGATIVE
Trichomonas vaginosis: NEGATIVE

## 2020-07-28 ENCOUNTER — Telehealth: Payer: Self-pay | Admitting: *Deleted

## 2020-07-28 NOTE — Telephone Encounter (Signed)
Leda Min, RN  07/28/2020 9:21 AM EDT Back to Top    Left message to call Noreene Larsson, RN at [facility] [phone].

## 2020-07-28 NOTE — Telephone Encounter (Signed)
Spoke with patient. Advised per Dr. Hyacinth Meeker.  Completed diflucan x2.  Will complete Valtrex on 11/4.  She is using Kenalog ointment and taking amitriptyline as prescribed.  Patient states she may notice "a little relief, but not resolved". Denies any new symptoms. She will complete medication as prescribed. Advised patient I will update Dr. Hyacinth Meeker and our office will f/u with any recommendations, patient agreeable.   Routing to Dr. Hyacinth Meeker to review.

## 2020-07-28 NOTE — Telephone Encounter (Signed)
-----   Message from Jerene Bears, MD sent at 07/27/2020  6:40 AM EDT ----- Please let pt know her vaginitis testing is negative.  Could you please get an update for me about her symptoms?  She was having vulvar and buttocks skin burning sensation.  Thanks.  CC:  Zenovia Jordan, CMA

## 2020-07-30 NOTE — Telephone Encounter (Signed)
Spoke with patient. Advised per Dr. Hyacinth Meeker. Patient reports symptoms have been improving, will continue to monitor. Reviewed options for PCP. Questions answered. Patient verbalizes understanding and is agreeable.   Encounter closed.

## 2020-07-30 NOTE — Telephone Encounter (Signed)
Please let pt know if pain continues, esp without any new rash, I would recommend she see a PCP.  She would have already broken out with shingles rash by now so I don't think that is the cause.  Her symptoms were most consistent with nerve irritation and she ultimately may need to see another specialist but that referral would have to come from her PCP.  Thanks.

## 2020-08-18 ENCOUNTER — Other Ambulatory Visit: Payer: Self-pay | Admitting: Obstetrics & Gynecology

## 2020-08-18 DIAGNOSIS — F32A Depression, unspecified: Secondary | ICD-10-CM

## 2020-08-18 DIAGNOSIS — Z01419 Encounter for gynecological examination (general) (routine) without abnormal findings: Secondary | ICD-10-CM

## 2020-08-18 DIAGNOSIS — M9903 Segmental and somatic dysfunction of lumbar region: Secondary | ICD-10-CM | POA: Diagnosis not present

## 2020-08-18 DIAGNOSIS — M5441 Lumbago with sciatica, right side: Secondary | ICD-10-CM | POA: Diagnosis not present

## 2020-08-18 NOTE — Telephone Encounter (Signed)
I've sent in a 30 day refill, but she needs follow up. Please ask if the medication is making a difference in her pain.

## 2020-08-18 NOTE — Telephone Encounter (Signed)
Medication refill request: Amitriptyline 10mg   Last AEX:  12/09/19 Next AEX: 12/20/20 Last MMG (if hormonal medication request): NA Refill authorized: 30/0

## 2020-08-24 DIAGNOSIS — M9903 Segmental and somatic dysfunction of lumbar region: Secondary | ICD-10-CM | POA: Diagnosis not present

## 2020-08-24 DIAGNOSIS — M5441 Lumbago with sciatica, right side: Secondary | ICD-10-CM | POA: Diagnosis not present

## 2020-08-26 ENCOUNTER — Ambulatory Visit: Payer: 59 | Admitting: Allergy

## 2020-09-22 ENCOUNTER — Other Ambulatory Visit: Payer: Self-pay

## 2020-09-22 ENCOUNTER — Encounter: Payer: Self-pay | Admitting: Physician Assistant

## 2020-09-22 ENCOUNTER — Ambulatory Visit (INDEPENDENT_AMBULATORY_CARE_PROVIDER_SITE_OTHER): Payer: 59 | Admitting: Physician Assistant

## 2020-09-22 VITALS — BP 110/70 | HR 98 | Temp 98.7°F | Ht 66.5 in | Wt 150.2 lb

## 2020-09-22 DIAGNOSIS — R32 Unspecified urinary incontinence: Secondary | ICD-10-CM | POA: Diagnosis not present

## 2020-09-22 DIAGNOSIS — F418 Other specified anxiety disorders: Secondary | ICD-10-CM | POA: Diagnosis not present

## 2020-09-22 LAB — POCT URINALYSIS DIPSTICK
Bilirubin, UA: NEGATIVE
Blood, UA: NEGATIVE
Glucose, UA: NEGATIVE
Ketones, UA: NEGATIVE
Leukocytes, UA: NEGATIVE
Nitrite, UA: NEGATIVE
Protein, UA: NEGATIVE
Spec Grav, UA: 1.01 (ref 1.010–1.025)
Urobilinogen, UA: 0.2 E.U./dL
pH, UA: 6 (ref 5.0–8.0)

## 2020-09-22 NOTE — Patient Instructions (Signed)
It was great to see you!  I am placing a referral for pelvic floor PT and talk therapy. You should be contacted about this in the next week or two. If not, please let me know.  Message me after a few sessions of PT to let me know how its going!   Take care,  Jarold Motto PA-C

## 2020-09-22 NOTE — Progress Notes (Signed)
Sue Williams is a 39 y.o. female is here to establish care.  I acted as a Neurosurgeon for [employer], PA-C Corky Mull, LPN   History of Present Illness:   Chief Complaint  Patient presents with  . Establish Care  . Urinary Frequency  . Urinary Urgency  . Urinary Incontinence    HPI    Urinary issues Pt is c/o urinary frequency, urgency and incontinence x 1 yr. Pt has seen a urologist and was tried on Oxybutin and Myrbetriq but made her unable to urinate and have nausea. She is having to wear a pad. She is having significant decreased quality of life due to this. Labs done in March with ob-gyn, she was having symptoms at this time, and were essentially normal, including TSH and blood sugar.  She has seen Dr. Zenda Alpers at [facility] and had CT abd/pel and other work-up. These results are unavailable to me however she did state that she possibly had an incidental liver lesion that was found?    Anxiety  Has worsened over the past year. Able to fall asleep but difficulty staying asleep. Has never seen therapist or take medication.  Has significant anxiety about her bladder and this has worsened in the past year due to her uncontrolled symptoms. Denies SI/HI.   There are no preventive care reminders to display for this patient.  Past Medical History:  Diagnosis Date  . Anxiety   . Sebaceous cyst of skin of left breast    around areola  . Urinary frequency      Social History   Tobacco Use  . Smoking status: Former Smoker    Types: Cigarettes  . Smokeless tobacco: Never Used  . Tobacco comment: occ  Vaping Use  . Vaping Use: Never used  Substance Use Topics  . Alcohol use: Not Currently  . Drug use: Never    Past Surgical History:  Procedure Laterality Date  . BREAST CYST EXCISION Left 12/10/2014   Procedure: CYST EXCISION LEFT BREAST AEREOLA;  Surgeon: Almond Lint, MD;  Location: [facility];  Service: General;  Laterality: Left;   . NASAL SINUS SURGERY  2008  . TONSILLECTOMY    . WISDOM TOOTH EXTRACTION      Family History  Problem Relation Age of Onset  . Heart disease Father   . Diabetes Maternal Grandmother   . Heart disease Maternal Grandfather     PMHx, SurgHx, SocialHx, FamHx, Medications, and Allergies were reviewed in the Visit Navigator and updated as appropriate.   Patient Active Problem List   Diagnosis Date Noted  . Rash and other nonspecific skin eruption 07/06/2020  . Pruritus 07/06/2020  . Pain of left hip joint 10/16/2019    Social History   Tobacco Use  . Smoking status: Former Smoker    Types: Cigarettes  . Smokeless tobacco: Never Used  . Tobacco comment: occ  Vaping Use  . Vaping Use: Never used  Substance Use Topics  . Alcohol use: Not Currently  . Drug use: Never    Current Medications and Allergies:    Current Outpatient Medications:  .  CALCIUM-MAGNESIUM-ZINC PO, Take 1 capsule by mouth daily in the afternoon., Disp: , Rfl:  .  Multiple Vitamin (MULTIVITAMIN) tablet, Take 1 tablet by mouth daily., Disp: , Rfl:  .  norethindrone (NORLYDA) 0.35 MG tablet, Norlyda 0.35 mg tablet, Disp: 1 Package, Rfl: 12 .  Probiotic Product (PROBIOTIC PO), Take 1 capsule by mouth daily in the afternoon., Disp: ,  Rfl:  .  TURMERIC PO, Take 1,500 mg by mouth daily in the afternoon., Disp: , Rfl:    Allergies  Allergen Reactions  . Codeine Nausea And Vomiting  . Demerol [Meperidine] Nausea And Vomiting    Review of Systems   ROS Negative unless otherwise specified per HPI.  Vitals:   Vitals:   09/22/20 1041  BP: 110/70  Pulse: 98  Temp: 98.7 F (37.1 C)  TempSrc: Temporal  SpO2: 99%  Weight: 150 lb 4 oz (68.2 kg)  Height: 5' 6.5" (1.689 m)     Body mass index is 23.89 kg/m.   Physical Exam:    Physical Exam Vitals and nursing note reviewed.  Constitutional:      General: She is not in acute distress.    Appearance: She is well-developed. She is not  ill-appearing, toxic-appearing or sickly-appearing.  Cardiovascular:     Rate and Rhythm: Normal rate and regular rhythm.     Pulses: Normal pulses.     Heart sounds: Normal heart sounds, S1 normal and S2 normal.     Comments: No LE edema Pulmonary:     Effort: Pulmonary effort is normal.     Breath sounds: Normal breath sounds.  Skin:    General: Skin is warm, dry and intact.  Neurological:     Mental Status: She is alert.     GCS: GCS eye subscore is 4. GCS verbal subscore is 5. GCS motor subscore is 6.  Psychiatric:        Mood and Affect: Mood and affect normal.        Speech: Speech normal.        Behavior: Behavior normal. Behavior is cooperative.      Assessment and Plan:    Sue Williams was seen today for establish care, urinary frequency, urinary urgency and urinary incontinence.  Diagnoses and all orders for this visit:  Urinary incontinence, unspecified type UA negative. Uncontrolled. Referral for pelvic floor PT. Requesting records from [facility]. I have asked her to check in with me about a month after starting PT to let me know how its going. Next step is referral to new urologist. -     POCT urinalysis dipstick -     Ambulatory referral to Physical Therapy  Anxiety about health Uncontrolled. Denies SI/HI. Referral for talk therapy. Follow-up as needed. -     Ambulatory referral to Psychology  CMA or LPN served as scribe during this visit. History, Physical, and Plan performed by medical provider. The above documentation has been reviewed and is accurate and complete.  Jarold Motto, PA-C [place], [place] 09/22/2020  Follow-up: No follow-ups on file.

## 2020-09-28 ENCOUNTER — Encounter: Payer: Self-pay | Admitting: Physician Assistant

## 2020-09-28 DIAGNOSIS — K769 Liver disease, unspecified: Secondary | ICD-10-CM | POA: Insufficient documentation

## 2020-09-28 DIAGNOSIS — R350 Frequency of micturition: Secondary | ICD-10-CM | POA: Insufficient documentation

## 2020-09-30 DIAGNOSIS — Z1231 Encounter for screening mammogram for malignant neoplasm of breast: Secondary | ICD-10-CM | POA: Diagnosis not present

## 2020-09-30 LAB — HM MAMMOGRAPHY

## 2020-10-06 ENCOUNTER — Encounter: Payer: Self-pay | Admitting: Physician Assistant

## 2020-10-11 ENCOUNTER — Ambulatory Visit: Payer: 59 | Admitting: Psychology

## 2020-10-11 MED FILL — NORLYDA 0.35 MG TABS: 0.35 | 56 days supply | Qty: 56 | Fill #1

## 2020-10-12 DIAGNOSIS — R922 Inconclusive mammogram: Secondary | ICD-10-CM | POA: Diagnosis not present

## 2020-10-12 DIAGNOSIS — R928 Other abnormal and inconclusive findings on diagnostic imaging of breast: Secondary | ICD-10-CM | POA: Diagnosis not present

## 2020-10-19 DIAGNOSIS — M62838 Other muscle spasm: Secondary | ICD-10-CM | POA: Diagnosis not present

## 2020-10-19 DIAGNOSIS — R32 Unspecified urinary incontinence: Secondary | ICD-10-CM | POA: Diagnosis not present

## 2020-10-19 DIAGNOSIS — M6281 Muscle weakness (generalized): Secondary | ICD-10-CM | POA: Diagnosis not present

## 2020-10-27 DIAGNOSIS — M6281 Muscle weakness (generalized): Secondary | ICD-10-CM | POA: Diagnosis not present

## 2020-10-27 DIAGNOSIS — M62838 Other muscle spasm: Secondary | ICD-10-CM | POA: Diagnosis not present

## 2020-10-27 DIAGNOSIS — R32 Unspecified urinary incontinence: Secondary | ICD-10-CM | POA: Diagnosis not present

## 2020-11-03 ENCOUNTER — Encounter: Payer: Self-pay | Admitting: Physician Assistant

## 2020-11-04 ENCOUNTER — Ambulatory Visit: Payer: 59 | Admitting: Psychology

## 2020-11-04 DIAGNOSIS — M62838 Other muscle spasm: Secondary | ICD-10-CM | POA: Diagnosis not present

## 2020-11-04 DIAGNOSIS — M6281 Muscle weakness (generalized): Secondary | ICD-10-CM | POA: Diagnosis not present

## 2020-11-04 DIAGNOSIS — R32 Unspecified urinary incontinence: Secondary | ICD-10-CM | POA: Diagnosis not present

## 2020-11-23 ENCOUNTER — Other Ambulatory Visit: Payer: Self-pay

## 2020-11-23 ENCOUNTER — Ambulatory Visit: Payer: 59 | Admitting: Psychology

## 2020-11-23 DIAGNOSIS — Z3041 Encounter for surveillance of contraceptive pills: Secondary | ICD-10-CM

## 2020-11-23 MED ORDER — NORETHINDRONE 0.35 MG PO TABS: ORAL_TABLET | ORAL | 0 refills | Status: DC

## 2020-11-23 NOTE — Telephone Encounter (Signed)
Patient needs OC refill.  AEX scheduled 12/20/20. Last AEX was 12/09/2019.

## 2020-11-23 NOTE — Telephone Encounter (Signed)
Patient informed. 

## 2020-11-26 ENCOUNTER — Other Ambulatory Visit: Payer: Self-pay | Admitting: Obstetrics and Gynecology

## 2020-11-26 MED ORDER — NORETHINDRONE 0.35 MG PO TABS: 1.0000 | ORAL_TABLET | Freq: Every day | ORAL | 0 refills | Status: DC

## 2020-11-26 MED FILL — NORLYDA 0.35 MG TABS: 0.35 | 84 days supply | Qty: 84 | Fill #0

## 2020-11-26 NOTE — Addendum Note (Signed)
Addended by: Aura Camps on: 11/26/2020 10:03 AM   Modules accepted: Orders

## 2020-11-26 NOTE — Telephone Encounter (Addendum)
Pharmacy called back confused about the refill for birth control pills sent in. Reports the Rx does not have directions on it. Rx fixed and resent.

## 2020-12-01 ENCOUNTER — Ambulatory Visit (INDEPENDENT_AMBULATORY_CARE_PROVIDER_SITE_OTHER): Payer: 59 | Admitting: Psychology

## 2020-12-01 DIAGNOSIS — F411 Generalized anxiety disorder: Secondary | ICD-10-CM | POA: Diagnosis not present

## 2020-12-09 ENCOUNTER — Ambulatory Visit (INDEPENDENT_AMBULATORY_CARE_PROVIDER_SITE_OTHER): Payer: 59 | Admitting: Psychology

## 2020-12-09 DIAGNOSIS — F411 Generalized anxiety disorder: Secondary | ICD-10-CM

## 2020-12-20 ENCOUNTER — Ambulatory Visit: Payer: Self-pay | Admitting: Obstetrics and Gynecology

## 2020-12-28 ENCOUNTER — Ambulatory Visit (INDEPENDENT_AMBULATORY_CARE_PROVIDER_SITE_OTHER): Payer: 59 | Admitting: Psychology

## 2020-12-28 DIAGNOSIS — F411 Generalized anxiety disorder: Secondary | ICD-10-CM

## 2021-01-05 ENCOUNTER — Ambulatory Visit (INDEPENDENT_AMBULATORY_CARE_PROVIDER_SITE_OTHER): Payer: 59 | Admitting: Psychology

## 2021-01-05 DIAGNOSIS — F411 Generalized anxiety disorder: Secondary | ICD-10-CM | POA: Diagnosis not present

## 2021-01-13 ENCOUNTER — Ambulatory Visit (INDEPENDENT_AMBULATORY_CARE_PROVIDER_SITE_OTHER): Payer: 59 | Admitting: Psychology

## 2021-01-13 DIAGNOSIS — F411 Generalized anxiety disorder: Secondary | ICD-10-CM | POA: Diagnosis not present

## 2021-01-24 ENCOUNTER — Ambulatory Visit: Payer: 59 | Admitting: Psychology

## 2021-02-06 ENCOUNTER — Other Ambulatory Visit: Payer: Self-pay | Admitting: Obstetrics and Gynecology

## 2021-02-06 DIAGNOSIS — Z3041 Encounter for surveillance of contraceptive pills: Secondary | ICD-10-CM

## 2021-02-07 ENCOUNTER — Other Ambulatory Visit (HOSPITAL_COMMUNITY): Payer: Self-pay

## 2021-02-07 MED ORDER — NORETHINDRONE 0.35 MG PO TABS
1.0000 | ORAL_TABLET | Freq: Every day | ORAL | 0 refills | Status: DC
  Filled 2021-02-07 – 2021-02-08 (×2): qty 28, 28d supply, fill #0

## 2021-02-07 NOTE — Telephone Encounter (Signed)
annual exam scheduled on 03/24/21

## 2021-02-08 ENCOUNTER — Other Ambulatory Visit (HOSPITAL_COMMUNITY): Payer: Self-pay

## 2021-02-09 ENCOUNTER — Ambulatory Visit (INDEPENDENT_AMBULATORY_CARE_PROVIDER_SITE_OTHER): Payer: 59 | Admitting: Psychology

## 2021-02-09 DIAGNOSIS — F411 Generalized anxiety disorder: Secondary | ICD-10-CM

## 2021-02-14 ENCOUNTER — Other Ambulatory Visit (HOSPITAL_COMMUNITY): Payer: Self-pay

## 2021-03-08 ENCOUNTER — Ambulatory Visit: Payer: 59 | Admitting: Psychology

## 2021-03-23 NOTE — Progress Notes (Signed)
40 y.o. [id number] Single White or Caucasian Not Hispanic or Latino female here for annual exam. She is having some skin irritation. Patient is on POP.  Menses q month x 3 days, light, wears a light pad. No BTB. No cramps. Sexually active, same partner for over 10 years, live together.   She has intermittent vulvar irritation. No vaginal discharge, no odor, no significant itching.     Patient's last menstrual period was 03/23/2021 (exact date).          Sexually active: Yes.    The current method of family planning is POP (estrogen/progesterone).    Exercising: Yes.     Boxing  Smoker:  no  Health Maintenance: Pap:  12-05-2018 neg HPV HR neg,  10-07-15 neg History of abnormal Pap:  no MMG:  10/12/20 Bi-rads 1 Neg  BMD:   none  Colonoscopy: none  TDaP:  10/05/16  Gardasil: none    reports that she has quit smoking. Her smoking use included cigarettes. She has never used smokeless tobacco. She reports previous alcohol use. She reports that she does not use drugs. Rare ETOH. She works in the [employer] at [employer].   Past Medical History:  Diagnosis Date   Anxiety    Sebaceous cyst of skin of left breast    around areola   Urinary frequency     Past Surgical History:  Procedure Laterality Date   BREAST CYST EXCISION Left 12/10/2014   Procedure: CYST EXCISION LEFT BREAST AEREOLA;  Surgeon: Almond Lint, MD;  Location: [facility];  Service: General;  Laterality: Left;   NASAL SINUS SURGERY  2008   TONSILLECTOMY     WISDOM TOOTH EXTRACTION      Current Outpatient Medications  Medication Sig Dispense Refill   CALCIUM-MAGNESIUM-ZINC PO Take 1 capsule by mouth daily in the afternoon.     Multiple Vitamin (MULTIVITAMIN) tablet Take 1 tablet by mouth daily.     norethindrone (NORLYDA) 0.35 MG tablet TAKE 1 TABLET (0.35 MG TOTAL) BY MOUTH DAILY. 28 tablet 0   Probiotic Product (PROBIOTIC PO) Take 1 capsule by mouth daily in the afternoon.     TURMERIC PO Take 1,500 mg by mouth  daily in the afternoon.     No current facility-administered medications for this visit.    Family History  Problem Relation Age of Onset   Heart disease Father    Diabetes Maternal Grandmother    Heart disease Maternal Grandfather     Review of Systems  All other systems reviewed and are negative.  Exam:   BP 110/62   Pulse 69   Ht 5\' 7"  (1.702 m)   Wt 151 lb (68.5 kg)   LMP 03/23/2021 (Exact Date)   SpO2 99%   BMI 23.65 kg/m   Weight change: @WEIGHTCHANGE @ Height:   Height: 5\' 7"  (170.2 cm)  Ht Readings from Last 3 Encounters:  03/24/21 5\' 7"  (1.702 m)  09/22/20 5' 6.5" (1.689 m)  07/06/20 5' 7.5" (1.715 m)    General appearance: alert, cooperative and appears stated age Head: Normocephalic, without obvious abnormality, atraumatic Neck: no adenopathy, supple, symmetrical, trachea midline and thyroid normal to inspection and palpation Lungs: clear to auscultation bilaterally Cardiovascular: regular rate and rhythm Breasts: normal appearance, no masses or tenderness Abdomen: soft, non-tender; non distended,  no masses,  no organomegaly Extremities: extremities normal, atraumatic, no cyanosis or edema Skin: Skin color, texture, turgor normal. No rashes or lesions Lymph nodes: Cervical, supraclavicular, and axillary nodes normal. No  abnormal inguinal nodes palpated Neurologic: Grossly normal   Pelvic: External genitalia:  no lesions              Urethra:  normal appearing urethra with no masses, tenderness or lesions              Bartholins and Skenes: normal                 Vagina: normal appearing vagina with normal color and discharge, no lesions              Cervix: no lesions               Bimanual Exam:  Uterus:  normal size, contour, position, consistency, mobility, non-tender              Adnexa: no mass, fullness, tenderness               Rectovaginal: Confirms               Anus:  normal sphincter tone, no lesions  Carolynn Serve chaperoned for the  exam.  1. Well woman exam Doing well No pap this year Mammogram UTD  2. Encounter for surveillance of contraceptive pills Doing well on POP - norethindrone (NORLYDA) 0.35 MG tablet; TAKE 1 TABLET (0.35 MG TOTAL) BY MOUTH DAILY.  Dispense: 84 tablet; Refill: 3  3. Laboratory exam ordered as part of routine general medical examination - Lipid panel -Other labs normal last year  4. Immunization counseling Discussed the gardasil vaccination, she would like to start the series - HPV 9-valent vaccine,Recombinat

## 2021-03-24 ENCOUNTER — Ambulatory Visit (INDEPENDENT_AMBULATORY_CARE_PROVIDER_SITE_OTHER): Payer: 59 | Admitting: Obstetrics and Gynecology

## 2021-03-24 ENCOUNTER — Encounter: Payer: Self-pay | Admitting: Obstetrics and Gynecology

## 2021-03-24 ENCOUNTER — Other Ambulatory Visit (HOSPITAL_COMMUNITY): Payer: Self-pay

## 2021-03-24 ENCOUNTER — Other Ambulatory Visit: Payer: Self-pay

## 2021-03-24 ENCOUNTER — Ambulatory Visit (INDEPENDENT_AMBULATORY_CARE_PROVIDER_SITE_OTHER): Payer: 59 | Admitting: Psychology

## 2021-03-24 VITALS — BP 110/62 | HR 69 | Ht 67.0 in | Wt 151.0 lb

## 2021-03-24 DIAGNOSIS — F411 Generalized anxiety disorder: Secondary | ICD-10-CM | POA: Diagnosis not present

## 2021-03-24 DIAGNOSIS — Z01419 Encounter for gynecological examination (general) (routine) without abnormal findings: Secondary | ICD-10-CM

## 2021-03-24 DIAGNOSIS — Z0000 Encounter for general adult medical examination without abnormal findings: Secondary | ICD-10-CM | POA: Diagnosis not present

## 2021-03-24 DIAGNOSIS — Z3041 Encounter for surveillance of contraceptive pills: Secondary | ICD-10-CM

## 2021-03-24 DIAGNOSIS — Z23 Encounter for immunization: Secondary | ICD-10-CM

## 2021-03-24 DIAGNOSIS — Z7185 Encounter for immunization safety counseling: Secondary | ICD-10-CM | POA: Diagnosis not present

## 2021-03-24 LAB — LIPID PANEL
Cholesterol: 210 mg/dL — ABNORMAL HIGH (ref <200)
HDL: 59 mg/dL (ref >=50)
LDL Cholesterol (Calc): 134 mg/dL (calc) — ABNORMAL HIGH
Non-HDL Cholesterol (Calc): 151 mg/dL (calc) — ABNORMAL HIGH (ref <130)
Total CHOL/HDL Ratio: 3.6 (calc) (ref <5.0)
Triglycerides: 77 mg/dL (ref <150)

## 2021-03-24 MED ORDER — NORETHINDRONE 0.35 MG PO TABS
1.0000 | ORAL_TABLET | Freq: Every day | ORAL | 3 refills | Status: DC
  Filled 2021-03-24: qty 84, 84d supply, fill #0
  Filled 2021-06-20: qty 84, 84d supply, fill #1
  Filled 2021-08-30: qty 84, 84d supply, fill #2
  Filled 2021-11-14: qty 84, 84d supply, fill #3

## 2021-03-24 NOTE — Patient Instructions (Signed)

## 2021-04-12 ENCOUNTER — Ambulatory Visit (INDEPENDENT_AMBULATORY_CARE_PROVIDER_SITE_OTHER): Payer: 59 | Admitting: Psychology

## 2021-04-12 DIAGNOSIS — F411 Generalized anxiety disorder: Secondary | ICD-10-CM

## 2021-05-17 ENCOUNTER — Ambulatory Visit: Payer: 59 | Admitting: Psychology

## 2021-05-25 ENCOUNTER — Ambulatory Visit (INDEPENDENT_AMBULATORY_CARE_PROVIDER_SITE_OTHER): Payer: 59 | Admitting: *Deleted

## 2021-05-25 ENCOUNTER — Other Ambulatory Visit: Payer: Self-pay

## 2021-05-25 DIAGNOSIS — H52223 Regular astigmatism, bilateral: Secondary | ICD-10-CM | POA: Diagnosis not present

## 2021-05-25 DIAGNOSIS — H5213 Myopia, bilateral: Secondary | ICD-10-CM | POA: Diagnosis not present

## 2021-05-25 DIAGNOSIS — H524 Presbyopia: Secondary | ICD-10-CM | POA: Diagnosis not present

## 2021-05-25 DIAGNOSIS — Z23 Encounter for immunization: Secondary | ICD-10-CM | POA: Diagnosis not present

## 2021-05-26 ENCOUNTER — Ambulatory Visit: Payer: 59

## 2021-06-02 ENCOUNTER — Ambulatory Visit (INDEPENDENT_AMBULATORY_CARE_PROVIDER_SITE_OTHER): Payer: 59 | Admitting: Psychology

## 2021-06-02 DIAGNOSIS — F411 Generalized anxiety disorder: Secondary | ICD-10-CM

## 2021-06-20 ENCOUNTER — Other Ambulatory Visit (HOSPITAL_COMMUNITY): Payer: Self-pay

## 2021-07-26 ENCOUNTER — Ambulatory Visit: Payer: 59 | Attending: Internal Medicine

## 2021-07-26 ENCOUNTER — Other Ambulatory Visit (HOSPITAL_BASED_OUTPATIENT_CLINIC_OR_DEPARTMENT_OTHER): Payer: Self-pay

## 2021-07-26 DIAGNOSIS — Z23 Encounter for immunization: Secondary | ICD-10-CM

## 2021-07-26 MED ORDER — PFIZER COVID-19 VAC BIVALENT 30 MCG/0.3ML IM SUSP
INTRAMUSCULAR | 0 refills | Status: DC
Start: 2021-07-26 — End: 2021-09-07
  Filled 2021-07-26: qty 0.3, 1d supply, fill #0

## 2021-07-26 NOTE — Progress Notes (Signed)
   Covid-19 Vaccination Clinic  Name:  Sue Williams    MRN: [id number] DOB: 12/28/1980  07/26/2021  Ms. Barcelo was observed post Covid-19 immunization for 15 minutes without incident. She was provided with Vaccine Information Sheet and instruction to access the V-Safe system.   Ms. Lagerquist was instructed to call 911 with any severe reactions post vaccine: Difficulty breathing  Swelling of face and throat  A fast heartbeat  A bad rash all over body  Dizziness and weakness   Immunizations Administered     Name Date Dose VIS Date Route   Pfizer Covid-19 Vaccine Bivalent Booster 07/26/2021  9:38 AM 0.3 mL 05/25/2021 Intramuscular   Manufacturer: [employer], [employer]   Lot: [id number]   NDC: [phone]

## 2021-08-11 ENCOUNTER — Other Ambulatory Visit: Payer: Self-pay

## 2021-08-11 ENCOUNTER — Ambulatory Visit (INDEPENDENT_AMBULATORY_CARE_PROVIDER_SITE_OTHER): Payer: 59 | Admitting: Psychology

## 2021-08-11 DIAGNOSIS — F411 Generalized anxiety disorder: Secondary | ICD-10-CM | POA: Diagnosis not present

## 2021-08-30 ENCOUNTER — Other Ambulatory Visit (HOSPITAL_COMMUNITY): Payer: Self-pay

## 2021-09-06 NOTE — Progress Notes (Signed)
Subjective:    Sue Williams is a 40 y.o. female and is here for a comprehensive physical exam.   HPI  There are no preventive care reminders to display for this patient.  Acute Concerns: Situational Insomnia Sue Williams states she hasn't been able to stay asleep during the night for a while. She believes that her increased stress levels could be the reason for this. Denies frequent urination. At this time she is interested in trialing a medication at this time.   Chronic Issues: Anxiety Pt is currently not on any medication for this issue but is participating in talk therapy once a week which she finds beneficial. Although she is under situational stress, she is managing well. Denies SI/HI.   Hx of Liver Lesion  On 06/25/20, Sue Williams had a CT of body/Abdomen/ Pelvis completed due to frequent urination and microscopic hematuria. Findings were multiple low density lesions in liver. According the results, these are most likely benign especially without a cancer hx but MRI of the abdomen with and without contrast recommended to further evaluate as clinically warranted.  Currently Sue Williams said this was never further evaluated but at this time she is interested in having this further worked up. Denies any abdominal pain, nausea, or vomiting.   Health Maintenance: Immunizations -- Covid- UTD Influenza- Due  Tdap- UTD;2018 Colonoscopy -- N/A Mammogram -- UTD PAP -- UTD;2020 Bone Density -- N/A Dentistry- UTD Ophthalmology- UTD Diet -- Eats all food groups Sleep habits -- Trouble staying asleep Exercise -- Exercises daily Weight -- Down 12 pounds intentionally  Mood -- Stable Weight history: Wt Readings from Last 10 Encounters:  09/07/21 139 lb (63 kg)  03/24/21 151 lb (68.5 kg)  09/22/20 150 lb 4 oz (68.2 kg)  07/22/20 151 lb 9.6 oz (68.8 kg)  07/06/20 149 lb (67.6 kg)  06/18/20 151 lb (68.5 kg)  12/09/19 156 lb (70.8 kg)  07/22/19 157 lb (71.2 kg)  06/18/19 155 lb  3.2 oz (70.4 kg)  05/29/19 152 lb 12.8 oz (69.3 kg)   Body mass index is 21.77 kg/m. Patient's last menstrual period was 08/23/2021 (exact date). Alcohol use:  reports current alcohol use. Tobacco use:  Tobacco Use: Medium Risk   Smoking Tobacco Use: Former   Smokeless Tobacco Use: Never   Passive Exposure: Not on file     Depression screen [facility] 2/9 09/07/2021  Decreased Interest 0  Down, Depressed, Hopeless 0  PHQ - 2 Score 0  Altered sleeping 2  Tired, decreased energy 0  Change in appetite 0  Feeling bad or failure about yourself  0  Trouble concentrating 0  Moving slowly or fidgety/restless 0  Suicidal thoughts 0  PHQ-9 Score 2  Difficult doing work/chores Not difficult at all     Other providers/specialists: Patient Care Team: Inda Coke, [place] as PCP - General (Physician Assistant)    PMHx, SurgHx, SocialHx, Medications, and Allergies were reviewed in the Visit Navigator and updated as appropriate.   Past Medical History:  Diagnosis Date   Anxiety    Depression    Sebaceous cyst of skin of left breast    around areola   Urinary frequency      Past Surgical History:  Procedure Laterality Date   BREAST CYST EXCISION Left 12/10/2014   Procedure: CYST EXCISION LEFT BREAST AEREOLA;  Surgeon: Stark Klein, MD;  Location: [place];  Service: General;  Laterality: Left;   NASAL SINUS SURGERY  09/25/2006   TONSILLECTOMY  WISDOM TOOTH EXTRACTION       Family History  Problem Relation Age of Onset   COPD Mother    Heart disease Father    Diabetes Maternal Grandmother    Heart disease Maternal Grandfather     Social History   Tobacco Use   Smoking status: Former    Packs/day: 0.50    Years: 10.00    Pack years: 5.00    Types: Cigarettes   Smokeless tobacco: Never   Tobacco comments:    occ  Vaping Use   Vaping Use: Never used  Substance Use Topics   Alcohol use: Yes    Comment: very rare; one drink every few months    Drug use: No    Review of Systems:   Review of Systems  Constitutional:  Negative for chills, fever, malaise/fatigue and weight loss.  HENT:  Negative for hearing loss, sinus pain and sore throat.   Respiratory:  Negative for cough and hemoptysis.   Cardiovascular:  Negative for chest pain, palpitations, leg swelling and PND.  Gastrointestinal:  Negative for abdominal pain, constipation, diarrhea, heartburn, nausea and vomiting.  Genitourinary:  Negative for dysuria, frequency and urgency.  Musculoskeletal:  Negative for back pain, myalgias and neck pain.  Skin:  Negative for itching and rash.  Neurological:  Negative for dizziness, tingling, seizures and headaches.  Endo/Heme/Allergies:  Negative for polydipsia.  Psychiatric/Behavioral:  Negative for depression. The patient is not nervous/anxious.    Objective:   BP 110/76 (BP Location: Left Arm, Patient Position: Sitting, Cuff Size: Normal)    Pulse 75    Temp 98.7 F (37.1 C) (Temporal)    Ht 5\' 7"  (1.702 m)    Wt 139 lb (63 kg)    LMP 08/23/2021 (Exact Date)    SpO2 99%    BMI 21.77 kg/m  Body mass index is 21.77 kg/m.   General Appearance:    Alert, cooperative, no distress, appears stated age  Head:    Normocephalic, without obvious abnormality, atraumatic  Eyes:    PERRL, conjunctiva/corneas clear, EOM's intact, fundi    benign, both eyes  Ears:    Normal TM's and external ear canals, both ears  Nose:   Nares normal, septum midline, mucosa normal, no drainage    or sinus tenderness  Throat:   Lips, mucosa, and tongue normal; teeth and gums normal  Neck:   Supple, symmetrical, trachea midline, no adenopathy;    thyroid:  no enlargement/tenderness/nodules; no carotid   bruit or JVD  Back:     Symmetric, no curvature, ROM normal, no CVA tenderness  Lungs:     Clear to auscultation bilaterally, respirations unlabored  Chest Wall:    No tenderness or deformity   Heart:    Regular rate and rhythm, S1 and S2 normal, no murmur,  rub or gallop  Breast Exam:    Deferred  Abdomen:     Soft, non-tender, bowel sounds active all four quadrants,    no masses, no organomegaly  Genitalia:    Deferred  Extremities:   Extremities normal, atraumatic, no cyanosis or edema  Pulses:   2+ and symmetric all extremities  Skin:   Skin color, texture, turgor normal, no rashes or lesions  Lymph nodes:   Cervical, supraclavicular, and axillary nodes normal  Neurologic:   CNII-XII intact, normal strength, sensation and reflexes    throughout    Assessment/Plan:   Encounter for general adult medical examination with abnormal findings Today patient counseled on age appropriate  routine health concerns for screening and prevention, each reviewed and up to date or declined. Immunizations reviewed and up to date or declined. Labs ordered and reviewed. Risk factors for depression reviewed and negative. Hearing function and visual acuity are intact. ADLs screened and addressed as needed. Functional ability and level of safety reviewed and appropriate. Education, counseling and referrals performed based on assessed risks today. Patient provided with a copy of personalized plan for preventive services.  Anxiety Stable Denies any need for intervention Continue talk therapy  Adjustment insomnia Uncontrolled Trial trazodone 25-50 mg daily as needed I advised patient to reach out if symptoms show no improvement or worsen   Lesion of liver Incidental finding found on CT Will order MR Abdomen with/ without contrast for further evaluation Follow up as indicated by results  Patient Counseling: [x]    Nutrition: Stressed importance of moderation in sodium/caffeine intake, saturated fat and cholesterol, caloric balance, sufficient intake of fresh fruits, vegetables, fiber, calcium, iron, and 1 mg of folate supplement per day (for females capable of pregnancy).  [x]    Stressed the importance of regular exercise.   [x]    Substance Abuse: Discussed  cessation/primary prevention of tobacco, alcohol, or other drug use; driving or other dangerous activities under the influence; availability of treatment for abuse.   [x]    Injury prevention: Discussed safety belts, safety helmets, smoke detector, smoking near bedding or upholstery.   [x]    Sexuality: Discussed sexually transmitted diseases, partner selection, use of condoms, avoidance of unintended pregnancy  and contraceptive alternatives.  [x]    Dental health: Discussed importance of regular tooth brushing, flossing, and dental visits.  [x]    Health maintenance and immunizations reviewed. Please refer to Health maintenance section.   I,Havlyn C Ratchford,acting as a for , PA.,have documented all relevant documentation on the behalf of , PA,as directed by  , PA while in the presence of , .  I, , Neurosurgeon, have reviewed all documentation for this visit. The documentation on 09/07/21 for the exam, diagnosis, procedures, and orders are all accurate and complete.  Jarold Motto, PA-C [facility]

## 2021-09-07 ENCOUNTER — Ambulatory Visit (INDEPENDENT_AMBULATORY_CARE_PROVIDER_SITE_OTHER): Payer: 59 | Admitting: Physician Assistant

## 2021-09-07 ENCOUNTER — Other Ambulatory Visit: Payer: Self-pay

## 2021-09-07 ENCOUNTER — Encounter: Payer: Self-pay | Admitting: Physician Assistant

## 2021-09-07 ENCOUNTER — Other Ambulatory Visit (HOSPITAL_COMMUNITY): Payer: Self-pay

## 2021-09-07 VITALS — BP 110/76 | HR 75 | Temp 98.7°F | Ht 67.0 in | Wt 139.0 lb

## 2021-09-07 DIAGNOSIS — K769 Liver disease, unspecified: Secondary | ICD-10-CM

## 2021-09-07 DIAGNOSIS — F419 Anxiety disorder, unspecified: Secondary | ICD-10-CM

## 2021-09-07 DIAGNOSIS — F5102 Adjustment insomnia: Secondary | ICD-10-CM

## 2021-09-07 DIAGNOSIS — Z0001 Encounter for general adult medical examination with abnormal findings: Secondary | ICD-10-CM | POA: Diagnosis not present

## 2021-09-07 LAB — CBC WITH DIFFERENTIAL/PLATELET
Basophils Absolute: 0 10*3/uL (ref 0.0–0.1)
Basophils Relative: 0.5 % (ref 0.0–3.0)
Eosinophils Absolute: 0 10*3/uL (ref 0.0–0.7)
Eosinophils Relative: 0.5 % (ref 0.0–5.0)
HCT: 43.6 % (ref 36.0–46.0)
Hemoglobin: 14.5 g/dL (ref 12.0–15.0)
Lymphocytes Relative: 26.2 % (ref 12.0–46.0)
Lymphs Abs: 2.4 10*3/uL (ref 0.7–4.0)
MCHC: 33.3 g/dL (ref 30.0–36.0)
MCV: 93.1 fl (ref 78.0–100.0)
Monocytes Absolute: 0.6 10*3/uL (ref 0.1–1.0)
Monocytes Relative: 6.1 % (ref 3.0–12.0)
Neutro Abs: 6.2 10*3/uL (ref 1.4–7.7)
Neutrophils Relative %: 66.7 % (ref 43.0–77.0)
Platelets: 306 10*3/uL (ref 150.0–400.0)
RBC: 4.68 Mil/uL (ref 3.87–5.11)
RDW: 13.4 % (ref 11.5–15.5)
WBC: 9.3 10*3/uL (ref 4.0–10.5)

## 2021-09-07 LAB — COMPREHENSIVE METABOLIC PANEL
ALT: 11 U/L (ref 0–35)
AST: 15 U/L (ref 0–37)
Albumin: 4.5 g/dL (ref 3.5–5.2)
Alkaline Phosphatase: 61 U/L (ref 39–117)
BUN: 14 mg/dL (ref 6–23)
CO2: 29 mEq/L (ref 19–32)
Calcium: 10.6 mg/dL — ABNORMAL HIGH (ref 8.4–10.5)
Chloride: 106 mEq/L (ref 96–112)
Creatinine, Ser: 0.57 mg/dL (ref 0.40–1.20)
GFR: 112.81 mL/min (ref >60.00)
Glucose, Bld: 87 mg/dL (ref 70–99)
Potassium: 5 mEq/L (ref 3.5–5.1)
Sodium: 142 mEq/L (ref 135–145)
Total Bilirubin: 1.2 mg/dL (ref 0.2–1.2)
Total Protein: 6.7 g/dL (ref 6.0–8.3)

## 2021-09-07 MED ORDER — TRAZODONE HCL 50 MG PO TABS
25.0000 mg | ORAL_TABLET | Freq: Every evening | ORAL | 3 refills | Status: DC | PRN
  Filled 2021-09-07: qty 30, 30d supply, fill #0
  Filled 2021-10-04: qty 30, 30d supply, fill #1

## 2021-09-07 NOTE — Patient Instructions (Addendum)
It was great to see you!  Trial trazodone for sleep -- let me know if this does not work for you  I'm going to put in a non-urgent MRI of your liver to further evaluate the liver lesion that was found on your CT last year. If you do not hear anything in a few weeks, let me know.  Please go to the lab for blood work.   Our office will call you with your results unless you have chosen to receive results via MyChart.  If your blood work is normal we will follow-up each year for physicals and as scheduled for chronic medical problems.  If anything is abnormal we will treat accordingly and get you in for a follow-up.  Take care,  Lelon Mast

## 2021-10-04 ENCOUNTER — Ambulatory Visit
Admission: RE | Admit: 2021-10-04 | Discharge: 2021-10-04 | Disposition: A | Discharge status: Home / Self Care | Payer: 59 | Source: Ambulatory Visit | Attending: Physician Assistant | Admitting: Physician Assistant

## 2021-10-04 ENCOUNTER — Other Ambulatory Visit: Payer: Self-pay

## 2021-10-04 ENCOUNTER — Ambulatory Visit (INDEPENDENT_AMBULATORY_CARE_PROVIDER_SITE_OTHER): Payer: 59

## 2021-10-04 DIAGNOSIS — Z23 Encounter for immunization: Secondary | ICD-10-CM

## 2021-10-04 DIAGNOSIS — K769 Liver disease, unspecified: Secondary | ICD-10-CM

## 2021-10-04 DIAGNOSIS — N281 Cyst of kidney, acquired: Secondary | ICD-10-CM | POA: Diagnosis not present

## 2021-10-04 DIAGNOSIS — K7689 Other specified diseases of liver: Secondary | ICD-10-CM | POA: Diagnosis not present

## 2021-10-04 MED ORDER — GADOBENATE DIMEGLUMINE 529 MG/ML IV SOLN
12.0000 mL | Freq: Once | INTRAVENOUS | Status: AC | PRN
  Administered 2021-10-04: 12 mL via INTRAVENOUS

## 2021-10-05 ENCOUNTER — Other Ambulatory Visit (HOSPITAL_COMMUNITY): Payer: Self-pay

## 2021-10-12 ENCOUNTER — Encounter: Payer: Self-pay | Admitting: Physician Assistant

## 2021-10-20 ENCOUNTER — Other Ambulatory Visit: Payer: Self-pay | Admitting: Physician Assistant

## 2021-10-20 ENCOUNTER — Other Ambulatory Visit (HOSPITAL_COMMUNITY): Payer: Self-pay

## 2021-10-20 MED ORDER — HYDROXYZINE PAMOATE 25 MG PO CAPS
25.0000 mg | ORAL_CAPSULE | Freq: Every evening | ORAL | 0 refills | Status: DC | PRN
Start: 2021-10-20 — End: 2021-11-11
  Filled 2021-10-20: qty 30, 30d supply, fill #0

## 2021-10-31 DIAGNOSIS — Z1231 Encounter for screening mammogram for malignant neoplasm of breast: Secondary | ICD-10-CM | POA: Diagnosis not present

## 2021-10-31 LAB — HM MAMMOGRAPHY

## 2021-11-03 ENCOUNTER — Encounter: Payer: Self-pay | Admitting: Physician Assistant

## 2021-11-11 ENCOUNTER — Other Ambulatory Visit (HOSPITAL_COMMUNITY): Payer: Self-pay

## 2021-11-11 ENCOUNTER — Other Ambulatory Visit: Payer: Self-pay | Admitting: Physician Assistant

## 2021-11-11 MED ORDER — HYDROXYZINE PAMOATE 25 MG PO CAPS
25.0000 mg | ORAL_CAPSULE | Freq: Every evening | ORAL | 1 refills | Status: DC | PRN
  Filled 2021-11-11: qty 90, 90d supply, fill #0
  Filled 2022-02-01: qty 90, 90d supply, fill #1

## 2021-11-14 ENCOUNTER — Other Ambulatory Visit (HOSPITAL_COMMUNITY): Payer: Self-pay

## 2021-12-03 ENCOUNTER — Other Ambulatory Visit: Payer: Self-pay

## 2021-12-03 ENCOUNTER — Ambulatory Visit
Admission: EM | Admit: 2021-12-03 | Discharge: 2021-12-03 | Disposition: A | Discharge status: Home / Self Care | Payer: 59 | Attending: Internal Medicine | Admitting: Internal Medicine

## 2021-12-03 ENCOUNTER — Encounter: Payer: Self-pay | Admitting: Emergency Medicine

## 2021-12-03 DIAGNOSIS — H65193 Other acute nonsuppurative otitis media, bilateral: Secondary | ICD-10-CM | POA: Diagnosis not present

## 2021-12-03 MED ORDER — AMOXICILLIN 875 MG PO TABS: 875.0000 mg | ORAL_TABLET | Freq: Two times a day (BID) | ORAL | 0 refills | Status: AC

## 2021-12-03 NOTE — Discharge Instructions (Signed)
You have an ear infection in both of your ears.  This is being treated with amoxicillin antibiotic.  Please follow-up if symptoms persist or worsen. ?

## 2021-12-03 NOTE — ED Provider Notes (Signed)
?[facility] ? ? ? ?CSN: [id number] ?Arrival date & time: 12/03/21  1155 ? ? ?  ? ?History   ?Chief Complaint ?Chief Complaint  ?Patient presents with  ? Otalgia  ? ? ?HPI ?Sue Williams is a 41 y.o. female.  ? ?Patient presents with bilateral ear pain that started approximately 2 to 3 days ago after she traveled on an airplane.  Denies any drainage or decreased hearing from ears.  Denies any trauma or foreign body to the ear.  She has had some associated nasal congestion as well.  Denies any fever.  Patient has taken ibuprofen with minimal improvement. ? ? ?Otalgia ? ?Past Medical History:  ?Diagnosis Date  ? Anxiety   ? Depression   ? Sebaceous cyst of skin of left breast   ? around areola  ? Urinary frequency   ? ? ?Patient Active Problem List  ? Diagnosis Date Noted  ? Urinary frequency 09/28/2020  ? Lesion of liver 09/28/2020  ? Rash and other nonspecific skin eruption 07/06/2020  ? Pruritus 07/06/2020  ? Pain of left hip joint 10/16/2019  ? ? ?Past Surgical History:  ?Procedure Laterality Date  ? BREAST CYST EXCISION Left 12/10/2014  ? Procedure: CYST EXCISION LEFT BREAST AEREOLA;  Surgeon: Almond LintFaera Byerly, MD;  Location: [facility];  Service: General;  Laterality: Left;  ? NASAL SINUS SURGERY  09/25/2006  ? TONSILLECTOMY    ? WISDOM TOOTH EXTRACTION    ? ? ?OB History   ? ? Gravida  ?4  ? Para  ?   ? Term  ?   ? Preterm  ?   ? AB  ?4  ? Living  ?0  ?  ? ? SAB  ?1  ? IAB  ?3  ? Ectopic  ?   ? Multiple  ?   ? Live Births  ?   ?   ?  ?  ? ? ? ?Home Medications   ? ?Prior to Admission medications   ?Medication Sig Start Date End Date Taking? Authorizing Provider  ?amoxicillin (AMOXIL) 875 MG tablet Take 1 tablet (875 mg total) by mouth 2 (two) times daily for 10 days. 12/03/21 12/13/21 Yes Gustavus BryantMound, Stavros Cail E, FNP  ?CALCIUM-MAGNESIUM-ZINC PO Take 1 capsule by mouth daily in the afternoon.   Yes [provider]  ?hydrOXYzine (VISTARIL) 25 MG capsule Take 1 capsule (25 mg total) by  mouth at bedtime as needed (insomnia). 11/11/21  Yes Jarold MottoWorley, Samantha, PA  ?Multiple Vitamin (MULTIVITAMIN) tablet Take 1 tablet by mouth daily.   Yes [provider]  ?norethindrone (NORLYDA) 0.35 MG tablet Take 1 tablet (0.35 mg total) by mouth daily. 03/24/21  Yes Romualdo BolkJertson, Jill Evelyn, MD  ?Probiotic Product (PROBIOTIC PO) Take 1 capsule by mouth daily in the afternoon.   Yes [provider]  ?TURMERIC PO Take 1,500 mg by mouth daily in the afternoon.   Yes [provider]  ?traZODone (DESYREL) 50 MG tablet Take 0.5-1 tablets (25-50 mg total) by mouth at bedtime as needed for sleep. 09/07/21   Jarold MottoWorley, Samantha, PA  ? ? ?Family History ?Family History  ?Problem Relation Age of Onset  ? COPD Mother   ? Heart disease Father   ? Diabetes Maternal Grandmother   ? Heart disease Maternal Grandfather   ? ? ?Social History ?Social History  ? ?Tobacco Use  ? Smoking status: Former  ?  Packs/day: 0.50  ?  Years: 10.00  ?  Pack years: 5.00  ?  Types: Cigarettes  ? Smokeless tobacco: Never  ? Tobacco comments:  ?  occ  ?Vaping Use  ? Vaping Use: Never used  ?Substance Use Topics  ? Alcohol use: Yes  ?  Comment: very rare; one drink every few months  ? Drug use: No  ? ? ? ?Allergies   ?Codeine and Demerol [meperidine] ? ? ?Review of Systems ?Review of Systems ?Per HPI ? ?Physical Exam ?Triage Vital Signs ?ED Triage Vitals [12/03/21 1209]  ?Enc Vitals Group  ?   BP 115/79  ?   Pulse Rate 79  ?   Resp 18  ?   Temp 98.5 ?F (36.9 ?C)  ?   Temp Source Oral  ?   SpO2 97 %  ?   Weight 138 lb 14.2 oz (63 kg)  ?   Height 5\' 7"  (1.702 m)  ?   Head Circumference   ?   Peak Flow   ?   Pain Score 5  ?   Pain Loc   ?   Pain Edu?   ?   Excl. in GC?   ? ?No data found. ? ?Updated Vital Signs ?BP 115/79 (BP Location: Left Arm)   Pulse 79   Temp 98.5 ?F (36.9 ?C) (Oral)   Resp 18   Ht 5\' 7"  (1.702 m)   Wt 138 lb 14.2 oz (63 kg)   LMP 11/14/2021   SpO2 97%   BMI 21.75 kg/m?  ? ?Visual Acuity ?Right Eye Distance:    ?Left Eye Distance:   ?Bilateral Distance:   ? ?Right Eye Near:   ?Left Eye Near:    ?Bilateral Near:    ? ?Physical Exam ?Constitutional:   ?   General: She is not in acute distress. ?   Appearance: Normal appearance. She is not toxic-appearing or diaphoretic.  ?HENT:  ?   Head: Normocephalic and atraumatic.  ?   Right Ear: Ear canal and external ear normal. No drainage, swelling or tenderness. No middle ear effusion. There is no impacted cerumen. No foreign body. No mastoid tenderness. Tympanic membrane is erythematous. Tympanic membrane is not perforated or bulging.  ?   Left Ear: Ear canal and external ear normal. No drainage, swelling or tenderness.  No middle ear effusion. There is no impacted cerumen. No foreign body. No mastoid tenderness. Tympanic membrane is erythematous. Tympanic membrane is not perforated or bulging.  ?Eyes:  ?   Extraocular Movements: Extraocular movements intact.  ?   Conjunctiva/sclera: Conjunctivae normal.  ?Pulmonary:  ?   Effort: Pulmonary effort is normal.  ?Neurological:  ?   General: No focal deficit present.  ?   Mental Status: She is alert and oriented to person, place, and time. Mental status is at baseline.  ?Psychiatric:     ?   Mood and Affect: Mood normal.     ?   Behavior: Behavior normal.     ?   Thought Content: Thought content normal.     ?   Judgment: Judgment normal.  ? ? ? ?UC Treatments / Results  ?Labs ?(all labs ordered are listed, but only abnormal results are displayed) ?Labs Reviewed - No data to display ? ?EKG ? ? ?Radiology ?No results found. ? ?Procedures ?Procedures (including critical care time) ? ?Medications Ordered in UC ?Medications - No data to display ? ?Initial Impression / Assessment and Plan / UC Course  ?I have reviewed the triage vital signs and the nursing notes. ? ?Pertinent labs & imaging results  that were available during my care of the patient were reviewed by me and considered in my medical decision making (see chart for details). ? ?   ? ?Will treat bilateral otitis media with amoxicillin. Tympanic membranes intact. No concern for barotrauma. Discussed supportive care with patient. Discussed strict return precautions. Patient verbalized understanding and was agreeable with plan.  ?Final Clinical Impressions(s) / UC Diagnoses  ? ?Final diagnoses:  ?Other non-recurrent acute nonsuppurative otitis media of both ears  ? ? ? ?Discharge Instructions   ? ?  ?You have an ear infection in both of your ears.  This is being treated with amoxicillin antibiotic.  Please follow-up if symptoms persist or worsen. ? ? ? ?ED Prescriptions   ? ? Medication Sig Dispense Auth. Provider  ? amoxicillin (AMOXIL) 875 MG tablet Take 1 tablet (875 mg total) by mouth 2 (two) times daily for 10 days. 20 tablet Ervin Knack E, [place]  ? ?  ? ?PDMP not reviewed this encounter. ?  ?Gustavus Bryant, [place] ?12/03/21 1238 ? ?

## 2021-12-03 NOTE — ED Triage Notes (Signed)
Patient c/o possible ear infection in her right ear x 2-3 days.  Patient recently went on a plane trip and noticed the pain after getting home.  No cold sx's.  Has taken Ibuprofen. ?

## 2021-12-08 ENCOUNTER — Ambulatory Visit (INDEPENDENT_AMBULATORY_CARE_PROVIDER_SITE_OTHER): Payer: 59 | Admitting: Psychology

## 2021-12-08 ENCOUNTER — Ambulatory Visit: Payer: 59 | Admitting: Psychology

## 2021-12-08 DIAGNOSIS — F411 Generalized anxiety disorder: Secondary | ICD-10-CM

## 2021-12-08 NOTE — Progress Notes (Signed)
PROGRESS NOTE - ANNUAL TREATMENT REVIEW ? ?Patient Name: Sue Williams  ?Date of Birth: 12-05-1980  ?MRN: [id number]  ?Date: 12/08/2021 ?Start:  10:00 AM ?End:    11:00 AM ? ?Today I met with  Mikea J Garske in remote video (WebEx) face-to-face individual psychotherapy as an accomodation to the COVID-19 Pandemic. ? ?Distance Site: Client's Home ?Orginating Site: Dr Jannifer Franklin Remote Office ?Consent: Obtained verbal consent to transmit  session remotely  ? 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  We d/e/p how it had been managing her anxiety and controlling her reactivity.  In general, she feels like her anxiety has improved but that she continues to have episodes of anxiety and would like to continue to learn coping skills and strategies to better manage her anxiety. ? ?In session today we conducted Sue Williams's annual treatment review.  We d/ treatment progress, d/ ongoing treatment goals and updated her treatment plan.  Sue Williams actively participated in the creation of and gave consent her to the treatment plan. ? ? ? ?Individualized Treatment Plan ?Strengths: verbal, bright, motivated, positive response to therapy to date  ?Supports: spouse, mother, friends  ? ?Goal/Needs for Treatment:  ?In order of importance to patient ?1) Decrease level of anxiety and frequency of anxiety episodes ?2) Learn and implement coping skills that result in a reduction of anxiety and worry, and improved daily functioning. ?3) Learn and implement personal and interpersonal skills to reduce anxiety and improve interpersonal relationships  ? ?Client Statement of Needs: Pt feels like her anxiety has improved but that she continues to have episodes of anxiety and would like to continue to learn coping skills and strategies to better manage her anxiety.  ? ?Treatment Level: Outpatient Individual Psychotherapy  ?Symptoms:Autonomic hyperactivity (e.g., palpitations, shortness of breath, dry mouth, trouble  swallowing, nausea, diarrhea). (Status: maintained) -- No Description Entered  ?Excessive and/or unrealistic worry that is difficult to control occurring more days than not for at least 6 months about a number of events or activities. (Status: maintained) -- No Description Entered  ?Motor tension (e.g., restlessness, tiredness, shakiness, muscle tension). (Status: maintained) -- No Description Entered ?Social withdrawal (e.g. as a means to avoid anxiety or conflict)  ? ?Client Treatment Preferences: Continue with current therapist  ? ?Healthcare consumer's goal for treatment: ? ?Psychologist, Royetta Crochet, Ph.D. will support the patient's ability to achieve the goals identified. Psychoeducation, Cognitive Behavioral Therapy, Dialectical Behavioral Therapy, Motivational Interviewing, and other evidenced-based practices will be used to promote progress towards healthy functioning.  ? ?Healthcare consumer will: Actively participate in therapy, working towards healthy functioning.  ?  ?*Justification for Continuation/Discontinuation of Goal: R=Revised, O=Ongoing, A=Achieved, D=Discontinued ? ?Goal 1) Decrease level of anxiety and frequency of anxiety episodes ? ?Target Date Goal Was reviewed Status Code Progress towards goal/Likert rating  ?12/09/2022     ?     ?     ? ?Goal 2) Learn and implement coping skills that result in a reduction of anxiety and worry, and improved daily functioning. ? ?Target Date Goal Was reviewed Status Code Progress towards goal/Likert rating  ?12/09/2022     ?     ?     ? ?Goal 3) Learn and implement personal and interpersonal skills to reduce anxiety and improve interpersonal relationships ? ?Target Date Goal Was reviewed Status Code Progress towards goal/Likert rating  ?12/09/2022     ?     ?     ? ?This plan has been reviewed  and created by the following participants:  This plan will be reviewed at least every 12 months. ?Date Behavioral Health Clinician Date Guardian/Patient    ?12/08/2021 Royetta Crochet, Ph.D.   50/[id number] Palmer J Heckendorn   ?     ?     ?     ?  ? ? ? ? ? ? ? ?

## 2022-01-05 ENCOUNTER — Ambulatory Visit: Payer: 59 | Admitting: Psychology

## 2022-02-01 ENCOUNTER — Other Ambulatory Visit: Payer: Self-pay | Admitting: Obstetrics and Gynecology

## 2022-02-01 ENCOUNTER — Other Ambulatory Visit (HOSPITAL_COMMUNITY): Payer: Self-pay

## 2022-02-01 DIAGNOSIS — Z3041 Encounter for surveillance of contraceptive pills: Secondary | ICD-10-CM

## 2022-02-01 NOTE — Telephone Encounter (Signed)
Annual exam scheduled on 04/04/22 ?Last annual exam was 02/2021 ?

## 2022-02-02 ENCOUNTER — Other Ambulatory Visit (HOSPITAL_COMMUNITY): Payer: Self-pay

## 2022-02-02 MED ORDER — NORETHINDRONE 0.35 MG PO TABS
1.0000 | ORAL_TABLET | Freq: Every day | ORAL | 0 refills | Status: DC
  Filled 2022-02-02: qty 84, 84d supply, fill #0

## 2022-03-14 ENCOUNTER — Ambulatory Visit: Payer: 59 | Admitting: Physician Assistant

## 2022-03-14 ENCOUNTER — Ambulatory Visit (HOSPITAL_COMMUNITY)
Admission: RE | Admit: 2022-03-14 | Discharge: 2022-03-14 | Disposition: A | Discharge status: Home / Self Care | Payer: 59 | Source: Ambulatory Visit | Attending: Physician Assistant | Admitting: Physician Assistant

## 2022-03-14 ENCOUNTER — Encounter: Payer: Self-pay | Admitting: Physician Assistant

## 2022-03-14 ENCOUNTER — Other Ambulatory Visit: Payer: Self-pay | Admitting: Physician Assistant

## 2022-03-14 VITALS — BP 116/80 | HR 97 | Temp 98.5°F | Ht 67.0 in | Wt 135.4 lb

## 2022-03-14 DIAGNOSIS — R109 Unspecified abdominal pain: Secondary | ICD-10-CM | POA: Diagnosis not present

## 2022-03-14 DIAGNOSIS — K529 Noninfective gastroenteritis and colitis, unspecified: Secondary | ICD-10-CM | POA: Diagnosis not present

## 2022-03-14 DIAGNOSIS — R1013 Epigastric pain: Secondary | ICD-10-CM

## 2022-03-14 LAB — POC URINALSYSI DIPSTICK (AUTOMATED)
Bilirubin, UA: NEGATIVE
Blood, UA: POSITIVE
Glucose, UA: NEGATIVE
Ketones, UA: POSITIVE
Nitrite, UA: NEGATIVE
Protein, UA: NEGATIVE
Spec Grav, UA: <=1.005 — AB (ref 1.010–1.025)
Urobilinogen, UA: 0.2 E.U./dL
pH, UA: 6.5 (ref 5.0–8.0)

## 2022-03-14 LAB — POCT URINE PREGNANCY: Preg Test, Ur: NEGATIVE

## 2022-03-14 MED ORDER — IOHEXOL 300 MG/ML  SOLN
100.0000 mL | Freq: Once | INTRAMUSCULAR | Status: AC | PRN
  Administered 2022-03-14: 75 mL via INTRAVENOUS

## 2022-03-14 MED ORDER — SODIUM CHLORIDE (PF) 0.9 % IJ SOLN
INTRAMUSCULAR | Status: AC
  Filled 2022-03-14: qty 50

## 2022-03-14 MED ORDER — DICYCLOMINE HCL 10 MG PO CAPS
10.0000 mg | ORAL_CAPSULE | Freq: Three times a day (TID) | ORAL | 0 refills | Status: DC
  Filled 2022-03-14: qty 56, 14d supply, fill #0

## 2022-03-14 MED ORDER — PANTOPRAZOLE SODIUM 40 MG PO TBEC
40.0000 mg | DELAYED_RELEASE_TABLET | Freq: Every day | ORAL | 0 refills | Status: DC
  Filled 2022-03-14: qty 30, 30d supply, fill #0

## 2022-03-14 MED ORDER — IOHEXOL 9 MG/ML PO SOLN
ORAL | Status: AC
  Filled 2022-03-14: qty 1000

## 2022-03-14 NOTE — Patient Instructions (Signed)
It was great to see you!  Please go to [facility] long radiology now.   I will be in touch with results as soon as they are back.  If you feel ANY WORSE --> GO TO THE ER   Take care,  Jarold Motto PA-C

## 2022-03-14 NOTE — Progress Notes (Addendum)
Sue Williams is a 41 y.o. female here for a new problem of abdominal pain.   History of Present Illness:   Chief Complaint  Patient presents with   Abdominal Pain    Pt c/o abdominal pain radiating to the right side, dull/sharp doesn't last long. Has been going on since Saturday. Has tried pepcid and tums which did not help, does not think its acid reflux. Randomly happens.    HPI  Abdominal Pain Patient complain of abdominal pain that has been onset for 4 days. She described this as "dull/sharp" pain which radiates down to right side. Symptoms getting worse for past few days. She has been feeling nausea recently but does not thinks this related to acid reflux. States she has waking up at night due to the pain.  She states she never had anything like this before. States she has not been able to eat. Had eaten only few bites of food since yesterday. Has tried Pepcid and Tums with no relief. Thinks she might have ulcer. Drinks alcohol occasionally.  Denies fever or chills. No reported urinary symptoms or vaginal discharge. Denies hx of kidney stones. Denies recent NSAID or ASA use.  She suspects that she has some constipation.  Past Medical History:  Diagnosis Date   Anxiety    Depression    Sebaceous cyst of skin of left breast    around areola   Urinary frequency      Social History   Tobacco Use   Smoking status: Former    Packs/day: 0.50    Years: 10.00    Total pack years: 5.00    Types: Cigarettes   Smokeless tobacco: Never   Tobacco comments:    occ  Vaping Use   Vaping Use: Never used  Substance Use Topics   Alcohol use: Yes    Comment: very rare; one drink every few months   Drug use: No    Past Surgical History:  Procedure Laterality Date   BREAST CYST EXCISION Left 12/10/2014   Procedure: CYST EXCISION LEFT BREAST AEREOLA;  Surgeon: Almond Lint, MD;  Location: [facility];  Service: General;  Laterality: Left;   NASAL SINUS SURGERY   09/25/2006   TONSILLECTOMY     WISDOM TOOTH EXTRACTION      Family History  Problem Relation Age of Onset   COPD Mother    Heart disease Father    Diabetes Maternal Grandmother    Heart disease Maternal Grandfather     Allergies  Allergen Reactions   Codeine Nausea And Vomiting   Demerol [Meperidine] Nausea And Vomiting    Current Medications:   Current Outpatient Medications:    CALCIUM-MAGNESIUM-ZINC PO, Take 1 capsule by mouth daily in the afternoon., Disp: , Rfl:    hydrOXYzine (VISTARIL) 25 MG capsule, Take 1 capsule (25 mg total) by mouth at bedtime as needed (insomnia)., Disp: 90 capsule, Rfl: 1   Multiple Vitamin (MULTIVITAMIN) tablet, Take 1 tablet by mouth daily., Disp: , Rfl:    norethindrone (NORLYDA) 0.35 MG tablet, Take 1 tablet (0.35 mg total) by mouth daily., Disp: 84 tablet, Rfl: 0   Probiotic Product (PROBIOTIC PO), Take 1 capsule by mouth daily in the afternoon., Disp: , Rfl:    TURMERIC PO, Take 1,500 mg by mouth daily in the afternoon., Disp: , Rfl:    traZODone (DESYREL) 50 MG tablet, Take 0.5-1 tablets (25-50 mg total) by mouth at bedtime as needed for sleep. (Patient not taking: Reported on 03/14/2022), Disp:  30 tablet, Rfl: 3   Review of Systems:   ROS Negative unless otherwise specified per HPI.   Vitals:   Vitals:   03/14/22 1534  BP: 116/80  Pulse: 97  Temp: 98.5 F (36.9 C)  TempSrc: Temporal  SpO2: 97%  Weight: 135 lb 6 oz (61.4 kg)  Height: 5\' 7"  (1.702 m)     Body mass index is 21.2 kg/m.  Physical Exam:   Physical Exam Vitals and nursing note reviewed.  Constitutional:      General: She is not in acute distress.    Appearance: She is well-developed. She is not ill-appearing or toxic-appearing.  Cardiovascular:     Rate and Rhythm: Normal rate and regular rhythm.     Pulses: Normal pulses.     Heart sounds: Normal heart sounds, S1 normal and S2 normal.  Pulmonary:     Effort: Pulmonary effort is normal.     Breath sounds:  Normal breath sounds.  Abdominal:     General: Abdomen is flat. Bowel sounds are normal.     Palpations: Abdomen is soft.     Tenderness: There is generalized abdominal tenderness. There is guarding. There is no right CVA tenderness, left CVA tenderness or rebound. Negative signs include McBurney's sign.  Skin:    General: Skin is warm and dry.  Neurological:     Mental Status: She is alert.     GCS: GCS eye subscore is 4. GCS verbal subscore is 5. GCS motor subscore is 6.  Psychiatric:        Speech: Speech normal.        Behavior: Behavior normal. Behavior is cooperative.    Results for orders placed or performed in visit on 03/14/22  POCT urine pregnancy  Result Value Ref Range   Preg Test, Ur Negative Negative  POCT Urinalysis Dipstick (Automated)  Result Value Ref Range   Color, UA yellow    Clarity, UA little cloudy    Glucose, UA Negative Negative   Bilirubin, UA neg    Ketones, UA positive    Spec Grav, UA <=1.005 (A) 1.010 - 1.025   Blood, UA positive    pH, UA 6.5 5.0 - 8.0   Protein, UA Negative Negative   Urobilinogen, UA 0.2 0.2 or 1.0 E.U./dL   Nitrite, UA neg    Leukocytes, UA       Assessment and Plan:   Epigastric abdominal pain Vitals stable in office Urine pregnancy test negative Urine is positive for blood and ketones Will order blood work and stat CT scan stat due to significant abdominal tenderness -- ddx includes: constipation, IBS, pancreatitis, PUD, gastritis, nephrolithiasis, cholelithiasis, appendicitis among others If any new/worsening symptoms in the meantime, patient was instructed to go to the ER  I,Savera Zaman,acting as a scribe for 03/16/22, PA.,have documented all relevant documentation on the behalf of [employer], PA,as directed by  Jarold Motto, PA while in the presence of Jarold Motto, Jarold Motto.   I, [place], Jarold Motto, have reviewed all documentation for this visit. The documentation on 03/14/22 for the exam,  diagnosis, procedures, and orders are all accurate and complete.  Time spent with patient today was 28 minutes which consisted of chart review, discussing diagnosis, work up, treatment answering questions and documentation.   03/16/22, PA-C

## 2022-03-15 ENCOUNTER — Other Ambulatory Visit: Payer: Self-pay | Admitting: Physician Assistant

## 2022-03-15 ENCOUNTER — Other Ambulatory Visit (HOSPITAL_COMMUNITY): Payer: Self-pay

## 2022-03-15 ENCOUNTER — Encounter: Payer: Self-pay | Admitting: Physician Assistant

## 2022-03-15 DIAGNOSIS — K50019 Crohn's disease of small intestine with unspecified complications: Secondary | ICD-10-CM

## 2022-03-15 LAB — CBC WITH DIFFERENTIAL/PLATELET
Basophils Absolute: 0.1 10*3/uL (ref 0.0–0.1)
Basophils Relative: 0.6 % (ref 0.0–3.0)
Eosinophils Absolute: 0.1 10*3/uL (ref 0.0–0.7)
Eosinophils Relative: 0.4 % (ref 0.0–5.0)
HCT: 41.3 % (ref 36.0–46.0)
Hemoglobin: 13.7 g/dL (ref 12.0–15.0)
Lymphocytes Relative: 16.8 % (ref 12.0–46.0)
Lymphs Abs: 2.5 10*3/uL (ref 0.7–4.0)
MCHC: 33.2 g/dL (ref 30.0–36.0)
MCV: 92.6 fl (ref 78.0–100.0)
Monocytes Absolute: 1.4 10*3/uL — ABNORMAL HIGH (ref 0.1–1.0)
Monocytes Relative: 9 % (ref 3.0–12.0)
Neutro Abs: 11.1 10*3/uL — ABNORMAL HIGH (ref 1.4–7.7)
Neutrophils Relative %: 73.2 % (ref 43.0–77.0)
Platelets: 365 10*3/uL (ref 150.0–400.0)
RBC: 4.46 Mil/uL (ref 3.87–5.11)
RDW: 13.6 % (ref 11.5–15.5)
WBC: 15.1 10*3/uL — ABNORMAL HIGH (ref 4.0–10.5)

## 2022-03-15 LAB — COMPREHENSIVE METABOLIC PANEL
ALT: 8 U/L (ref 0–35)
AST: 20 U/L (ref 0–37)
Albumin: 4.2 g/dL (ref 3.5–5.2)
Alkaline Phosphatase: 69 U/L (ref 39–117)
BUN: 10 mg/dL (ref 6–23)
CO2: 27 mEq/L (ref 19–32)
Calcium: 9.5 mg/dL (ref 8.4–10.5)
Chloride: 101 mEq/L (ref 96–112)
Creatinine, Ser: 0.65 mg/dL (ref 0.40–1.20)
GFR: 108.9 mL/min (ref >60.00)
Glucose, Bld: 77 mg/dL (ref 70–99)
Potassium: 4.6 mEq/L (ref 3.5–5.1)
Sodium: 136 mEq/L (ref 135–145)
Total Bilirubin: 0.5 mg/dL (ref 0.2–1.2)
Total Protein: 7.1 g/dL (ref 6.0–8.3)

## 2022-03-15 LAB — LIPASE: Lipase: 13 U/L (ref 11.0–59.0)

## 2022-03-15 LAB — H. PYLORI ANTIBODY, IGG: H Pylori IgG: NEGATIVE

## 2022-03-15 MED ORDER — CIPROFLOXACIN HCL 500 MG PO TABS
500.0000 mg | ORAL_TABLET | Freq: Two times a day (BID) | ORAL | 0 refills | Status: AC
  Filled 2022-03-15: qty 14, 7d supply, fill #0

## 2022-03-15 MED ORDER — METRONIDAZOLE 500 MG PO TABS
500.0000 mg | ORAL_TABLET | Freq: Three times a day (TID) | ORAL | 0 refills | Status: AC
  Filled 2022-03-15: qty 21, 7d supply, fill #0

## 2022-03-17 ENCOUNTER — Ambulatory Visit: Payer: 59 | Admitting: Gastroenterology

## 2022-03-17 ENCOUNTER — Encounter: Payer: Self-pay | Admitting: Gastroenterology

## 2022-03-17 ENCOUNTER — Other Ambulatory Visit (HOSPITAL_COMMUNITY): Payer: Self-pay

## 2022-03-17 VITALS — BP 118/78 | HR 91 | Ht 67.0 in | Wt 137.1 lb

## 2022-03-17 DIAGNOSIS — K5900 Constipation, unspecified: Secondary | ICD-10-CM | POA: Diagnosis not present

## 2022-03-17 DIAGNOSIS — K529 Noninfective gastroenteritis and colitis, unspecified: Secondary | ICD-10-CM | POA: Diagnosis not present

## 2022-03-17 DIAGNOSIS — R933 Abnormal findings on diagnostic imaging of other parts of digestive tract: Secondary | ICD-10-CM

## 2022-03-17 MED ORDER — NA SULFATE-K SULFATE-MG SULF 17.5-3.13-1.6 GM/177ML PO SOLN
1.0000 | Freq: Once | ORAL | 0 refills | Status: AC
  Filled 2022-03-17: qty 354, 1d supply, fill #0

## 2022-03-22 ENCOUNTER — Other Ambulatory Visit (HOSPITAL_COMMUNITY): Payer: Self-pay

## 2022-03-24 ENCOUNTER — Encounter: Payer: Self-pay | Admitting: Gastroenterology

## 2022-03-30 NOTE — Progress Notes (Signed)
41 y.o. [id number] Single White or Caucasian Not Hispanic or Latino female here for annual exam.  She is on POP. Lives with her long term partner. No dyspareunia.  Period Cycle (Days): 28 (24-28) Period Duration (Days): 3-4 Period Pattern: Regular Menstrual Flow: Moderate Menstrual Control: Maxi pad Menstrual Control Change Freq (Hours): 4-6 Dysmenorrhea: (!) Mild Dysmenorrhea Symptoms: Cramping  Patient's last menstrual period was 03/22/2022 (approximate).          Sexually active: Yes.    The current method of family planning is POP (progesterone).    Exercising: Yes.     Cardio and boxing  Smoker:  no  Health Maintenance: Pap:  12/05/18 neg HR HPV Neg, 10/07/15 Neg  History of abnormal Pap:  no MMG:  10/31/21 Bi-rads 1 neg  BMD:   n/a Colonoscopy: last week for pain, it was normal. CT with terminal ileitis. She is better.  TDaP:  10/05/16  Gardasil: complete    reports that she has quit smoking. Her smoking use included cigarettes. She has a 5.00 pack-year smoking history. She has never used smokeless tobacco. She reports current alcohol use. She reports that she does not use drugs. Rare ETOH. She works in the [employer] at [employer].   Past Medical History:  Diagnosis Date   Anxiety    Depression    Sebaceous cyst of skin of left breast    around areola   Urinary frequency     Past Surgical History:  Procedure Laterality Date   BREAST CYST EXCISION Left 12/10/2014   Procedure: CYST EXCISION LEFT BREAST AEREOLA;  Surgeon: Almond Lint, MD;  Location: [facility];  Service: General;  Laterality: Left;   NASAL SINUS SURGERY  09/25/2006   TONSILLECTOMY     WISDOM TOOTH EXTRACTION      Current Outpatient Medications  Medication Sig Dispense Refill   CALCIUM-MAGNESIUM-ZINC PO Take 1 capsule by mouth daily in the afternoon.     hydrOXYzine (VISTARIL) 25 MG capsule Take 1 capsule (25 mg total) by mouth at bedtime as needed (insomnia). 90 capsule 1   Multiple Vitamin  (MULTIVITAMIN) tablet Take 1 tablet by mouth daily.     norethindrone (NORLYDA) 0.35 MG tablet Take 1 tablet (0.35 mg total) by mouth daily. 84 tablet 0   pantoprazole (PROTONIX) 40 MG tablet Take 1 tablet (40 mg total) by mouth daily. 30 tablet 0   Probiotic Product (PROBIOTIC PO) Take 1 capsule by mouth daily in the afternoon.     TURMERIC PO Take 1,500 mg by mouth daily in the afternoon.     No current facility-administered medications for this visit.    Family History  Problem Relation Age of Onset   COPD Mother    Heart disease Father    Diabetes Maternal Grandmother    Heart disease Maternal Grandfather    Colon cancer Neg Hx    Esophageal cancer Neg Hx    Rectal cancer Neg Hx    Stomach cancer Neg Hx    Liver cancer Neg Hx    Pancreatic cancer Neg Hx     Review of Systems  All other systems reviewed and are negative.   Exam:   BP 110/72   Pulse 64   Ht 5\' 7"  (1.702 m)   Wt 136 lb (61.7 kg)   LMP 03/22/2022 (Approximate) Comment: pt signed a waiver to not have pregnancy test.  SpO2 98%   BMI 21.30 kg/m   Weight change: @WEIGHTCHANGE @ Height:   Height: 5\' 7"  (  170.2 cm)  Ht Readings from Last 3 Encounters:  04/04/22 5\' 7"  (1.702 m)  03/31/22 5\' 7"  (1.702 m)  03/17/22 5\' 7"  (1.702 m)    General appearance: alert, cooperative and appears stated age Head: Normocephalic, without obvious abnormality, atraumatic Neck: no adenopathy, supple, symmetrical, trachea midline and thyroid normal to inspection and palpation Lungs: clear to auscultation bilaterally Cardiovascular: regular rate and rhythm Breasts: normal appearance, no masses or tenderness Abdomen: soft, non-tender; non distended,  no masses,  no organomegaly Extremities: extremities normal, atraumatic, no cyanosis or edema Skin: Skin color, texture, turgor normal. No rashes or lesions Lymph nodes: Cervical, supraclavicular, and axillary nodes normal. No abnormal inguinal nodes palpated Neurologic: Grossly  normal   Pelvic: External genitalia:  no lesions              Urethra:  normal appearing urethra with no masses, tenderness or lesions              Bartholins and Skenes: normal                 Vagina: normal appearing vagina with normal color and discharge, no lesions              Cervix: no lesions               Bimanual Exam:  Uterus:  normal size, contour, position, consistency, mobility, non-tender              Adnexa: no mass, fullness, tenderness               Rectovaginal: Confirms               Anus:  normal sphincter tone, no lesions  , CMA chaperoned for the exam.  1. Well woman exam Discussed breast self exam Discussed calcium and vit D intake No pap this year Mammogram UTD CBC and CMP UTD  2. Elevated LDL cholesterol level - Lipid panel  3. Encounter for surveillance of contraceptive pills Doing well - norethindrone (NORLYDA) 0.35 MG tablet; Take 1 tablet (0.35 mg total) by mouth daily.  Dispense: 84 tablet; Refill: 0

## 2022-03-31 ENCOUNTER — Ambulatory Visit (AMBULATORY_SURGERY_CENTER): Payer: 59 | Admitting: Gastroenterology

## 2022-03-31 ENCOUNTER — Encounter: Payer: Self-pay | Admitting: Gastroenterology

## 2022-03-31 VITALS — BP 102/65 | HR 55 | Temp 98.6°F | Resp 15 | Ht 67.0 in | Wt 137.0 lb

## 2022-03-31 DIAGNOSIS — R933 Abnormal findings on diagnostic imaging of other parts of digestive tract: Secondary | ICD-10-CM

## 2022-03-31 DIAGNOSIS — K529 Noninfective gastroenteritis and colitis, unspecified: Secondary | ICD-10-CM | POA: Diagnosis not present

## 2022-03-31 MED ORDER — SODIUM CHLORIDE 0.9 % IV SOLN: 500.0000 mL | Freq: Once | INTRAVENOUS | Status: DC

## 2022-03-31 NOTE — Patient Instructions (Signed)
YOU HAD AN ENDOSCOPIC PROCEDURE TODAY AT THE [facility]:   Refer to the procedure report that was given to you for any specific questions about what was found during the examination.  If the procedure report does not answer your questions, please call your gastroenterologist to clarify.  If you requested that your care partner not be given the details of your procedure findings, then the procedure report has been included in a sealed envelope for you to review at your convenience later.  YOU SHOULD EXPECT: Some feelings of bloating in the abdomen. Passage of more gas than usual.  Walking can help get rid of the air that was put into your GI tract during the procedure and reduce the bloating. If you had a lower endoscopy (such as a colonoscopy or flexible sigmoidoscopy) you may notice spotting of blood in your stool or on the toilet paper. If you underwent a bowel prep for your procedure, you may not have a normal bowel movement for a few days.  Please Note:  You might notice some irritation and congestion in your nose or some drainage.  This is from the oxygen used during your procedure.  There is no need for concern and it should clear up in a day or so.  SYMPTOMS TO REPORT IMMEDIATELY:  Following lower endoscopy (colonoscopy or flexible sigmoidoscopy):  Excessive amounts of blood in the stool  Significant tenderness or worsening of abdominal pains  Swelling of the abdomen that is new, acute  Fever of 100F or higher  Following upper endoscopy (EGD)  Vomiting of blood or coffee ground material  New chest pain or pain under the shoulder blades  Painful or persistently difficult swallowing  New shortness of breath  Fever of 100F or higher  Black, tarry-looking stools  For urgent or emergent issues, a gastroenterologist can be reached at any hour by calling [phone]. Do not use MyChart messaging for urgent concerns.    DIET:  We do recommend a small meal at first, but  then you may proceed to your regular diet.  Drink plenty of fluids but you should avoid alcoholic beverages for 24 hours.  ACTIVITY:  You should plan to take it easy for the rest of today and you should NOT DRIVE or use heavy machinery until tomorrow (because of the sedation medicines used during the test).    FOLLOW UP: Our staff will call the number listed on your records the next business day following your procedure.  We will call around 7:15- 8:00 am to check on you and address any questions or concerns that you may have regarding the information given to you following your procedure. If we do not reach you, we will leave a message.  If you develop any symptoms (ie: fever, flu-like symptoms, shortness of breath, cough etc.) before then, please call [phone].  If you test positive for Covid 19 in the 2 weeks post procedure, please call and report this information to us.    If any biopsies were taken you will be contacted by phone or by letter within the next 1-3 weeks.  Please call us at [phone] if you have not heard about the biopsies in 3 weeks.    SIGNATURES/CONFIDENTIALITY: You and/or your care partner have signed paperwork which will be entered into your electronic medical record.  These signatures attest to the fact that that the information above on your After Visit Summary has been reviewed and is understood.  Full responsibility of the confidentiality   of this discharge information lies with you and/or your care-partner.  

## 2022-03-31 NOTE — Progress Notes (Signed)
Per pt last menstrual l cycle was 03/20/22.  Pt refused pregnancy test and she signed a waiver to not have a pregnancy test. Maw  DT - VS

## 2022-03-31 NOTE — Op Note (Signed)
[facility] Patient Name: Sue Williams Procedure Date: 03/31/2022 2:31 PM MRN: [id number] Endoscopist: Lorin Picket E. Tomasa Rand , MD Age: 41 Referring MD:  Date of Birth: Jan 29, 1981 Gender: Female Account #: [id number] Procedure:                Colonoscopy Indications:              Abnormal CT of the GI tract (ileal thickening) Medicines:                Monitored Anesthesia Care Procedure:                Pre-Anesthesia Assessment:                           - Prior to the procedure, a History and Physical                            was performed, and patient medications and                            allergies were reviewed. The patient's tolerance of                            previous anesthesia was also reviewed. The risks                            and benefits of the procedure and the sedation                            options and risks were discussed with the patient.                            All questions were answered, and informed consent                            was obtained. Prior Anticoagulants: The patient has                            taken no previous anticoagulant or antiplatelet                            agents. ASA Grade Assessment: II - A patient with                            mild systemic disease. After reviewing the risks                            and benefits, the patient was deemed in                            satisfactory condition to undergo the procedure.                           After obtaining informed consent, the colonoscope  was passed under direct vision. Throughout the                            procedure, the patient's blood pressure, pulse, and                            oxygen saturations were monitored continuously. The                            Olympus PCF-H190DL (#[id number]) Colonoscope was                            introduced through the anus and advanced to the the                            terminal  ileum, with identification of the                            appendiceal orifice and IC valve. The colonoscopy                            was performed without difficulty. The patient                            tolerated the procedure well. The quality of the                            bowel preparation was adequate. The terminal ileum,                            ileocecal valve, appendiceal orifice, and rectum                            were photographed. The bowel preparation used was                            SUPREP via split dose instruction. Scope In: 2:37:28 PM Scope Out: 2:56:37 PM Scope Withdrawal Time: 0 hours 10 minutes 35 seconds  Total Procedure Duration: 0 hours 19 minutes 9 seconds  Findings:                 The perianal and digital rectal examinations were                            normal. Pertinent negatives include normal                            sphincter tone and no palpable rectal lesions.                           The colon (entire examined portion) appeared normal.                           The terminal ileum appeared normal.  The retroflexed view of the distal rectum and anal                            verge was normal and showed no anal or rectal                            abnormalities. Complications:            No immediate complications. Estimated Blood Loss:     Estimated blood loss: none. Impression:               - The entire examined colon is normal.                           - The examined portion of the ileum was normal.                           - The distal rectum and anal verge are normal on                            retroflexion view.                           - No specimens collected.                           - Suspect patient's symptoms and CT abnormalities                            were due to infectious ileitis. Recommendation:           - Patient has a contact number available for                             emergencies. The signs and symptoms of potential                            delayed complications were discussed with the                            patient. Return to normal activities tomorrow.                            Written discharge instructions were provided to the                            patient.                           - Resume previous diet.                           - Continue present medications.                           - Repeat colonoscopy in 10 years for screening  purposes. Nguyet Mercer E. Tomasa Rand, MD 03/31/2022 3:01:13 PM This report has been signed electronically.

## 2022-03-31 NOTE — Progress Notes (Signed)
Sedate, gd SR, tolerated procedure well, VSS, report to RN 

## 2022-03-31 NOTE — Progress Notes (Signed)
History and Physical Interval Note:  03/31/2022 2:30 PM  Sue Williams  has presented today for endoscopic procedure(s), with the diagnosis of  Encounter Diagnosis  Name Primary?   Ileitis Yes  .  The various methods of evaluation and treatment have been discussed with the patient and/or family. After consideration of risks, benefits and other options for treatment, the patient has consented to  the endoscopic procedure(s).   The patient's history has been reviewed, patient examined, no change in status, stable for endoscopic procedure(s).  I have reviewed the patient's chart and labs.  Questions were answered to the patient's satisfaction.     F[place]er [place] E. Tomasa Rand, MD [facility]

## 2022-04-03 ENCOUNTER — Telehealth: Payer: Self-pay | Admitting: *Deleted

## 2022-04-03 NOTE — Telephone Encounter (Signed)
  Follow up Call-     03/31/2022    1:55 PM  Call back number  Post procedure Call Back phone  # [phone] cell  Permission to leave phone message No     Patient questions:  Do you have a fever, pain , or abdominal swelling? No. Pain Score  0 *  Have you tolerated food without any problems? Yes.    Have you been able to return to your normal activities? No.  Do you have any questions about your discharge instructions: Diet   No. Medications  No. Follow up visit  No.  Do you have questions or concerns about your Care? No.  Actions: * If pain score is 4 or above: No action needed, pain <4.

## 2022-04-04 ENCOUNTER — Other Ambulatory Visit (HOSPITAL_COMMUNITY): Payer: Self-pay

## 2022-04-04 ENCOUNTER — Ambulatory Visit (INDEPENDENT_AMBULATORY_CARE_PROVIDER_SITE_OTHER): Payer: 59 | Admitting: Obstetrics and Gynecology

## 2022-04-04 ENCOUNTER — Encounter: Payer: Self-pay | Admitting: Obstetrics and Gynecology

## 2022-04-04 VITALS — BP 110/72 | HR 64 | Ht 67.0 in | Wt 136.0 lb

## 2022-04-04 DIAGNOSIS — Z3041 Encounter for surveillance of contraceptive pills: Secondary | ICD-10-CM

## 2022-04-04 DIAGNOSIS — E7800 Pure hypercholesterolemia, unspecified: Secondary | ICD-10-CM

## 2022-04-04 DIAGNOSIS — Z01419 Encounter for gynecological examination (general) (routine) without abnormal findings: Secondary | ICD-10-CM | POA: Diagnosis not present

## 2022-04-04 MED ORDER — NORETHINDRONE 0.35 MG PO TABS
1.0000 | ORAL_TABLET | Freq: Every day | ORAL | 0 refills | Status: DC
  Filled 2022-04-04 – 2022-05-06 (×3): qty 84, 84d supply, fill #0

## 2022-04-04 NOTE — Patient Instructions (Signed)

## 2022-04-05 ENCOUNTER — Ambulatory Visit: Payer: 59 | Admitting: Obstetrics and Gynecology

## 2022-04-05 LAB — LIPID PANEL
Cholesterol: 201 mg/dL — ABNORMAL HIGH (ref <200)
HDL: 65 mg/dL (ref >=50)
LDL Cholesterol (Calc): 118 mg/dL (calc) — ABNORMAL HIGH
Non-HDL Cholesterol (Calc): 136 mg/dL (calc) — ABNORMAL HIGH (ref <130)
Total CHOL/HDL Ratio: 3.1 (calc) (ref <5.0)
Triglycerides: 82 mg/dL (ref <150)

## 2022-04-07 ENCOUNTER — Other Ambulatory Visit: Payer: Self-pay | Admitting: Physician Assistant

## 2022-04-07 ENCOUNTER — Other Ambulatory Visit (HOSPITAL_COMMUNITY): Payer: Self-pay

## 2022-04-07 MED ORDER — HYDROXYZINE PAMOATE 25 MG PO CAPS
25.0000 mg | ORAL_CAPSULE | Freq: Every evening | ORAL | 1 refills | Status: DC | PRN
  Filled 2022-04-07 – 2022-05-06 (×2): qty 90, 90d supply, fill #0
  Filled 2022-07-25: qty 90, 90d supply, fill #1

## 2022-05-08 ENCOUNTER — Other Ambulatory Visit (HOSPITAL_COMMUNITY): Payer: Self-pay

## 2022-06-19 ENCOUNTER — Encounter: Payer: Self-pay | Admitting: *Deleted

## 2022-07-06 DIAGNOSIS — H524 Presbyopia: Secondary | ICD-10-CM | POA: Diagnosis not present

## 2022-07-25 ENCOUNTER — Other Ambulatory Visit (HOSPITAL_COMMUNITY): Payer: Self-pay

## 2022-07-25 ENCOUNTER — Other Ambulatory Visit: Payer: Self-pay | Admitting: Obstetrics and Gynecology

## 2022-07-25 DIAGNOSIS — Z3041 Encounter for surveillance of contraceptive pills: Secondary | ICD-10-CM

## 2022-07-25 MED ORDER — NORETHINDRONE 0.35 MG PO TABS
1.0000 | ORAL_TABLET | Freq: Every day | ORAL | 2 refills | Status: DC
  Filled 2022-07-25: qty 84, 84d supply, fill #0
  Filled 2022-10-14: qty 84, 84d supply, fill #1
  Filled 2023-01-11: qty 84, 84d supply, fill #2

## 2022-07-25 NOTE — Telephone Encounter (Signed)
Medication refill request: Norlyda  Last AEX:  04/04/22 Next AEX: nothing scheduled  Last MMG (if hormonal medication request): 10/31/21 Refill authorized: 84 with 0 rf pended

## 2022-09-07 ENCOUNTER — Encounter: Payer: Self-pay | Admitting: *Deleted

## 2022-09-14 ENCOUNTER — Ambulatory Visit (INDEPENDENT_AMBULATORY_CARE_PROVIDER_SITE_OTHER): Payer: 59 | Admitting: Physician Assistant

## 2022-09-14 ENCOUNTER — Other Ambulatory Visit (HOSPITAL_COMMUNITY): Payer: Self-pay

## 2022-09-14 ENCOUNTER — Other Ambulatory Visit: Payer: Self-pay | Admitting: Physician Assistant

## 2022-09-14 ENCOUNTER — Encounter: Payer: Self-pay | Admitting: Physician Assistant

## 2022-09-14 VITALS — BP 120/76 | HR 65 | Temp 98.0°F | Ht 67.0 in | Wt 139.5 lb

## 2022-09-14 DIAGNOSIS — H938X3 Other specified disorders of ear, bilateral: Secondary | ICD-10-CM | POA: Diagnosis not present

## 2022-09-14 DIAGNOSIS — Z0000 Encounter for general adult medical examination without abnormal findings: Secondary | ICD-10-CM | POA: Diagnosis not present

## 2022-09-14 DIAGNOSIS — E785 Hyperlipidemia, unspecified: Secondary | ICD-10-CM | POA: Diagnosis not present

## 2022-09-14 DIAGNOSIS — R17 Unspecified jaundice: Secondary | ICD-10-CM

## 2022-09-14 DIAGNOSIS — F5102 Adjustment insomnia: Secondary | ICD-10-CM

## 2022-09-14 LAB — COMPREHENSIVE METABOLIC PANEL
ALT: 14 U/L (ref 0–35)
AST: 14 U/L (ref 0–37)
Albumin: 4.4 g/dL (ref 3.5–5.2)
Alkaline Phosphatase: 54 U/L (ref 39–117)
BUN: 12 mg/dL (ref 6–23)
CO2: 29 mEq/L (ref 19–32)
Calcium: 10.2 mg/dL (ref 8.4–10.5)
Chloride: 105 mEq/L (ref 96–112)
Creatinine, Ser: 0.58 mg/dL (ref 0.40–1.20)
GFR: 111.54 mL/min (ref >60.00)
Glucose, Bld: 85 mg/dL (ref 70–99)
Potassium: 4.2 mEq/L (ref 3.5–5.1)
Sodium: 139 mEq/L (ref 135–145)
Total Bilirubin: 1.3 mg/dL — ABNORMAL HIGH (ref 0.2–1.2)
Total Protein: 6.2 g/dL (ref 6.0–8.3)

## 2022-09-14 LAB — LIPID PANEL
Cholesterol: 185 mg/dL (ref 0–200)
HDL: 66.6 mg/dL (ref >39.00)
LDL Cholesterol: 107 mg/dL — ABNORMAL HIGH (ref 0–99)
NonHDL: 117.9
Total CHOL/HDL Ratio: 3
Triglycerides: 53 mg/dL (ref 0.0–149.0)
VLDL: 10.6 mg/dL (ref 0.0–40.0)

## 2022-09-14 LAB — CBC WITH DIFFERENTIAL/PLATELET
Basophils Absolute: 0.1 10*3/uL (ref 0.0–0.1)
Basophils Relative: 0.6 % (ref 0.0–3.0)
Eosinophils Absolute: 0.1 10*3/uL (ref 0.0–0.7)
Eosinophils Relative: 0.9 % (ref 0.0–5.0)
HCT: 43.3 % (ref 36.0–46.0)
Hemoglobin: 14.5 g/dL (ref 12.0–15.0)
Lymphocytes Relative: 28.6 % (ref 12.0–46.0)
Lymphs Abs: 2.8 10*3/uL (ref 0.7–4.0)
MCHC: 33.6 g/dL (ref 30.0–36.0)
MCV: 92.6 fl (ref 78.0–100.0)
Monocytes Absolute: 0.7 10*3/uL (ref 0.1–1.0)
Monocytes Relative: 7.8 % (ref 3.0–12.0)
Neutro Abs: 6 10*3/uL (ref 1.4–7.7)
Neutrophils Relative %: 62.1 % (ref 43.0–77.0)
Platelets: 370 10*3/uL (ref 150.0–400.0)
RBC: 4.68 Mil/uL (ref 3.87–5.11)
RDW: 14 % (ref 11.5–15.5)
WBC: 9.6 10*3/uL (ref 4.0–10.5)

## 2022-09-14 MED ORDER — ACETIC ACID 2 % OT SOLN
4.0000 [drp] | OTIC | 0 refills | Status: DC
  Filled 2022-09-14: qty 15, 10d supply, fill #0

## 2022-09-14 NOTE — Patient Instructions (Addendum)
It was great to see you!  Try to increase the hydroxyzine to 50 mg for sleep and let me know how this works for you I've ordered a calcium score for [facility] - -they will contact you to schedule Trial the acetic acid drops for any drainage of your ears and/or flonase nasal spray for any pressure/muffled hearing sensation  Please go to the lab for blood work.   Our office will call you with your results unless you have chosen to receive results via MyChart.  If your blood work is normal we will follow-up each year for physicals and as scheduled for chronic medical problems.  If anything is abnormal we will treat accordingly and get you in for a follow-up.  Take care,  Lelon Mast

## 2022-09-14 NOTE — Progress Notes (Signed)
Subjective:    Sue Williams is a 41 y.o. female and is here for a comprehensive physical exam.  HPI  There are no preventive care reminders to display for this patient.   Acute Concerns: Ear drainage She feels that she has fluid on her ears. Clear drainage intermittently. Feels off balance briefly if she moves her head side to side. Symptoms have persisted since flight to [place] last month. Denies any ear pain. Does not use qtips. Has not been using airpods due to irritation when wearing them.  Chronic Issues: Insomnia Current medications: Hydroxyzine 25mg  at bedtime. Did not tolerate Trazodone due to strange dreams. Going to sleep around 9PM, waking up around 2AM most nights recently. Easily falls asleep with the Hydroxyzine but does not stay asleep. Not typically associated with nocturia. No caffeine later in the day. Does not exercise close to bedtime. Denies any sleep disturbance from boyfriend. Does not sleep with any pets in the bed.  Anxiety Current medications: Hydroxyzine 25mg  at bedtime. Has stopped talk therapy sessions but has been using workbooks and skills that therapist taught her. 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 Managing well. Denies SI/HI.    Hx of Liver Lesion  On 06/25/20, Sue Williams had a CT of body/Abdomen/ Pelvis completed due to frequent urination and microscopic hematuria. Findings were multiple low density lesions in liver. According the results, these are most likely benign especially without a cancer hx. Denies any abdominal pain, diarrhea, constipation, blood in her stools.  CT abdomen pelvis 03/14/2022: IMPRESSION: Terminal ileitis with right lower quadrant lymphadenopathy. Differential diagnosis for etiology includes infection, inflammation, less likely ischemia. Underlying mass lesion is not excluded. Recommend attention on follow-up.  MRI abdomen  10/04/2021: IMPRESSION: Enhancing T2 hyperintense hepatic lesions, corresponding with the lesions seen on prior CT, demonstrates imaging characteristics consistent with benign hepatic hemangiomas. No suspicious hepatic lesion.  Hyperlipidemia Has not been on any medications for this. Denies any family Hx of early CAD. Denies any chest pain, shortness of breath, or leg swelling. Agreeable to getting calcium score. Lab Results  Component Value Date   CHOL 201 (H) 04/04/2022   HDL 65 04/04/2022   LDLCALC 118 (H) 04/04/2022   TRIG 82 04/04/2022   CHOLHDL 3.1 04/04/2022      Health Maintenance: Immunizations -- UTD Tdap last completed 2018 Colonoscopy -- N/A Mammogram -- Last 10/2021 negative for malignancy PAP -- Last completed 2020 Bone Density -- N/A Diet -- Healthy balanced diet, only drinks water no sodas or juice Exercise -- Staying active, taking classes at the [facility], running/walking  Sleep habits -- insomnia as noted above Mood -- Doing well, improvement of anxiety as noted above  UTD with dentist? - Yes UTD with eye doctor? - Yes  Weight history: Wt Readings from Last 10 Encounters:  09/14/22 139 lb 8 oz (63.3 kg)  04/04/22 136 lb (61.7 kg)  03/31/22 137 lb (62.1 kg)  03/17/22 137 lb 2 oz (62.2 kg)  03/14/22 135 lb 6 oz (61.4 kg)  12/03/21 138 lb 14.2 oz (63 kg)  09/07/21 139 lb (63 kg)  03/24/21 151 lb (68.5 kg)  09/22/20 150 lb 4 oz (68.2 kg)  07/22/20 151 lb 9.6 oz (68.8 kg)   Body mass index is 21.85 kg/m. Patient's last menstrual period was 08/19/2022 (approximate).  Alcohol use:  reports current alcohol use.  Tobacco use:  Tobacco Use: Medium Risk (09/14/2022)   Patient History  Smoking Tobacco Use: Former    Smokeless Tobacco Use: Never    Passive Exposure: Not on file   Eligible for lung cancer screening? no     09/14/2022    9:00 AM  Depression screen PHQ 2/9  Decreased Interest 0  Down, Depressed, Hopeless 0  PHQ - 2 Score 0      Other providers/specialists: Patient Care Team: Jarold Motto, [place] as PCP - General (Physician Assistant)    PMHx, SurgHx, SocialHx, Medications, and Allergies were reviewed in the Visit Navigator and updated as appropriate.   Past Medical History:  Diagnosis Date   Allergy    Anxiety    Depression    Sebaceous cyst of skin of left breast    around areola   Urinary frequency      Past Surgical History:  Procedure Laterality Date   BREAST CYST EXCISION Left 12/10/2014   Procedure: CYST EXCISION LEFT BREAST AEREOLA;  Surgeon: Almond Lint, MD;  Location: [facility];  Service: General;  Laterality: Left;   NASAL SINUS SURGERY  09/25/2006   TONSILLECTOMY     WISDOM TOOTH EXTRACTION       Family History  Problem Relation Age of Onset   COPD Mother    Heart disease Father    Diabetes Maternal Grandmother    Heart disease Maternal Grandfather    Colon cancer Neg Hx    Esophageal cancer Neg Hx    Rectal cancer Neg Hx    Stomach cancer Neg Hx    Liver cancer Neg Hx    Pancreatic cancer Neg Hx     Social History   Tobacco Use   Smoking status: Former    Packs/day: 0.50    Years: 10.00    Total pack years: 5.00    Types: Cigarettes   Smokeless tobacco: Never   Tobacco comments:    occ  Vaping Use   Vaping Use: Never used  Substance Use Topics   Alcohol use: Yes    Comment: very rare; one drink every few months   Drug use: No    Review of Systems:   Review of Systems  Constitutional:  Negative for chills, fever, malaise/fatigue and weight loss.  HENT:  Positive for ear discharge. Negative for ear pain, hearing loss, sinus pain and sore throat.   Eyes: Negative.   Respiratory:  Negative for cough and hemoptysis.   Cardiovascular:  Negative for chest pain, palpitations, leg swelling and PND.  Gastrointestinal:  Negative for abdominal pain, constipation, diarrhea, heartburn, nausea and vomiting.  Genitourinary:  Negative for dysuria,  frequency and urgency.  Musculoskeletal:  Negative for back pain, myalgias and neck pain.  Skin:  Negative for itching and rash.  Neurological:  Negative for dizziness, tingling, seizures and headaches.  Endo/Heme/Allergies:  Negative for polydipsia.  Psychiatric/Behavioral:  Negative for depression. The patient is not nervous/anxious.     Objective:   BP 120/76 (BP Location: Left Arm, Patient Position: Sitting, Cuff Size: Normal)   Pulse 65   Temp 98 F (36.7 C) (Temporal)   Ht 5\' 7"  (1.702 m)   Wt 139 lb 8 oz (63.3 kg)   LMP 08/19/2022 (Approximate)   SpO2 99%   BMI 21.85 kg/m  Body mass index is 21.85 kg/m.   General Appearance:    Alert, cooperative, no distress, appears stated age  Head:    Normocephalic, without obvious abnormality, atraumatic  Eyes:    PERRL, conjunctiva/corneas clear, EOM's intact, fundi    benign, both  eyes  Ears:    Normal TM's and external ear canals, both ears  Nose:   Nares normal, septum midline, mucosa normal, no drainage    or sinus tenderness  Throat:   Lips, mucosa, and tongue normal; teeth and gums normal  Neck:   Supple, symmetrical, trachea midline, no adenopathy;    thyroid:  no enlargement/tenderness/nodules; no carotid   bruit or JVD  Back:     Symmetric, no curvature, ROM normal, no CVA tenderness  Lungs:     Clear to auscultation bilaterally, respirations unlabored  Chest Wall:    No tenderness or deformity   Heart:    Regular rate and rhythm, S1 and S2 normal, no murmur, rub or gallop  Breast Exam:    Deferred  Abdomen:     Soft, non-tender, bowel sounds active all four quadrants,    no masses, no organomegaly  Genitalia:    Deferred  Extremities:   Extremities normal, atraumatic, no cyanosis or edema  Pulses:   2+ and symmetric all extremities  Skin:   Skin color, texture, turgor normal, no rashes or lesions  Lymph nodes:   Cervical, supraclavicular, and axillary nodes normal  Neurologic:   CNII-XII intact, normal strength,  sensation and reflexes    throughout    Assessment/Plan:   Routine physical examination Today patient counseled on age appropriate routine health concerns for screening and prevention, each reviewed and up to date or declined. Immunizations reviewed and up to date or declined. Labs ordered and reviewed. Risk factors for depression reviewed and negative. Hearing function and visual acuity are intact. ADLs screened and addressed as needed. Functional ability and level of safety reviewed and appropriate. Education, counseling and referrals performed based on assessed risks today. Patient provided with a copy of personalized plan for preventive services.  Hyperlipidemia, unspecified hyperlipidemia type Update lipid panel Obtain calcium score  Adjustment insomnia Uncontrolled Increase hydroxyzine to 50 mg and follow-up if this is helpful or not Continue excellent sleep hygiene  Irritation of ear, bilateral No red flags Will provide rx for acetic acid drops to help dry fluid Consider flonase for ETD Follow-up as needed   I,Alexis Herring,acting as a scribe for [employer], PA.,have documented all relevant documentation on the behalf of Jarold Motto, PA,as directed by  Jarold Motto, PA while in the presence of Jarold Motto, [place].  I, Jarold Motto, [place], have reviewed all documentation for this visit. The documentation on 09/14/22 for the exam, diagnosis, procedures, and orders are all accurate and complete.  Jarold Motto, PA-C [facility]

## 2022-09-15 ENCOUNTER — Other Ambulatory Visit (HOSPITAL_COMMUNITY): Payer: Self-pay

## 2022-09-28 ENCOUNTER — Encounter: Payer: Self-pay | Admitting: Physician Assistant

## 2022-09-29 ENCOUNTER — Ambulatory Visit (HOSPITAL_COMMUNITY)
Admission: RE | Admit: 2022-09-29 | Discharge: 2022-09-29 | Disposition: A | Discharge status: Home / Self Care | Payer: Commercial Managed Care - PPO | Source: Ambulatory Visit | Attending: Physician Assistant | Admitting: Physician Assistant

## 2022-09-29 ENCOUNTER — Other Ambulatory Visit: Payer: Self-pay | Admitting: Physician Assistant

## 2022-09-29 ENCOUNTER — Other Ambulatory Visit (HOSPITAL_COMMUNITY): Payer: Self-pay

## 2022-09-29 DIAGNOSIS — E785 Hyperlipidemia, unspecified: Secondary | ICD-10-CM | POA: Insufficient documentation

## 2022-09-29 MED ORDER — GABAPENTIN 100 MG PO CAPS
ORAL_CAPSULE | ORAL | 1 refills | Status: DC
  Filled 2022-09-29: qty 30, 10d supply, fill #0
  Filled 2022-10-14: qty 30, 10d supply, fill #1

## 2022-10-16 ENCOUNTER — Other Ambulatory Visit: Payer: Self-pay

## 2022-10-17 ENCOUNTER — Other Ambulatory Visit (INDEPENDENT_AMBULATORY_CARE_PROVIDER_SITE_OTHER): Payer: 59

## 2022-10-17 DIAGNOSIS — R17 Unspecified jaundice: Secondary | ICD-10-CM

## 2022-10-17 LAB — HEPATIC FUNCTION PANEL
ALT: 10 U/L (ref 0–35)
AST: 14 U/L (ref 0–37)
Albumin: 4.5 g/dL (ref 3.5–5.2)
Alkaline Phosphatase: 55 U/L (ref 39–117)
Bilirubin, Direct: 0.2 mg/dL (ref 0.0–0.3)
Total Bilirubin: 1.3 mg/dL — ABNORMAL HIGH (ref 0.2–1.2)
Total Protein: 6.8 g/dL (ref 6.0–8.3)

## 2022-10-24 ENCOUNTER — Other Ambulatory Visit (HOSPITAL_COMMUNITY): Payer: Self-pay

## 2022-10-24 ENCOUNTER — Other Ambulatory Visit: Payer: Self-pay | Admitting: Physician Assistant

## 2022-10-24 MED ORDER — GABAPENTIN 100 MG PO CAPS
ORAL_CAPSULE | ORAL | 1 refills | Status: DC
  Filled 2022-10-24: qty 30, fill #0
  Filled 2022-11-11: qty 30, 10d supply, fill #0
  Filled 2022-12-02: qty 30, 10d supply, fill #1

## 2022-10-24 NOTE — Telephone Encounter (Signed)
Pt requesting refills for Gabapentin 100 mg. Last OV 09/14/2022.

## 2022-11-06 DIAGNOSIS — Z1231 Encounter for screening mammogram for malignant neoplasm of breast: Secondary | ICD-10-CM | POA: Diagnosis not present

## 2022-11-06 LAB — HM MAMMOGRAPHY

## 2022-11-13 ENCOUNTER — Other Ambulatory Visit (HOSPITAL_COMMUNITY): Payer: Self-pay

## 2022-12-05 ENCOUNTER — Other Ambulatory Visit (HOSPITAL_COMMUNITY): Payer: Self-pay

## 2022-12-07 ENCOUNTER — Other Ambulatory Visit (HOSPITAL_COMMUNITY): Payer: Self-pay

## 2022-12-07 ENCOUNTER — Encounter: Payer: Self-pay | Admitting: Physician Assistant

## 2022-12-07 ENCOUNTER — Other Ambulatory Visit: Payer: Self-pay

## 2022-12-07 MED ORDER — GABAPENTIN 100 MG PO CAPS
100.0000 mg | ORAL_CAPSULE | Freq: Two times a day (BID) | ORAL | 1 refills | Status: DC
  Filled 2022-12-07 – 2022-12-17 (×2): qty 180, 90d supply, fill #0
  Filled 2023-03-15: qty 180, 90d supply, fill #1

## 2022-12-17 ENCOUNTER — Other Ambulatory Visit: Payer: Self-pay

## 2022-12-18 ENCOUNTER — Other Ambulatory Visit (HOSPITAL_COMMUNITY): Payer: Self-pay

## 2023-02-03 IMAGING — MR MR ABDOMEN WO/W CM
11 of 17 series · 28 of 48 positions shown · IV contrast (12 ml Multihance)
Comparison: CT June 25, 2020

CLINICAL DATA: Further characterization of hepatic lesions seen on
prior imaging.

EXAM:
MRI ABDOMEN WITHOUT AND WITH CONTRAST
TECHNIQUE: Multiplanar multisequence MR imaging of the abdomen was performed
both before and after the administration of intravenous contrast.
CONTRAST:  12mL MULTIHANCE GADOBENATE DIMEGLUMINE 529 MG/ML IV SOLN

[Series 3: cor haste · coronal · 5.0mm · 0.68mm/px · 2 of 32 slices shown]
[im 1/32]
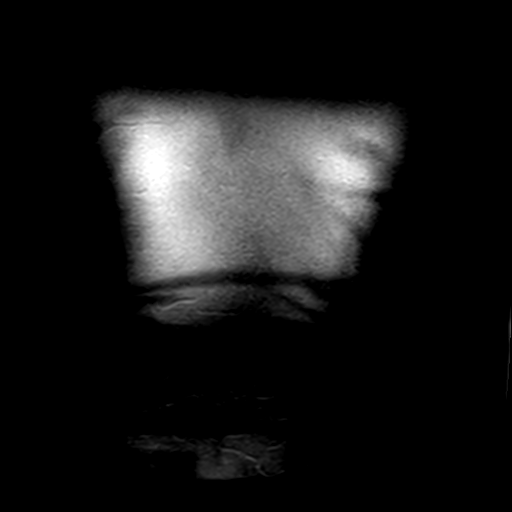
[im 32/32]
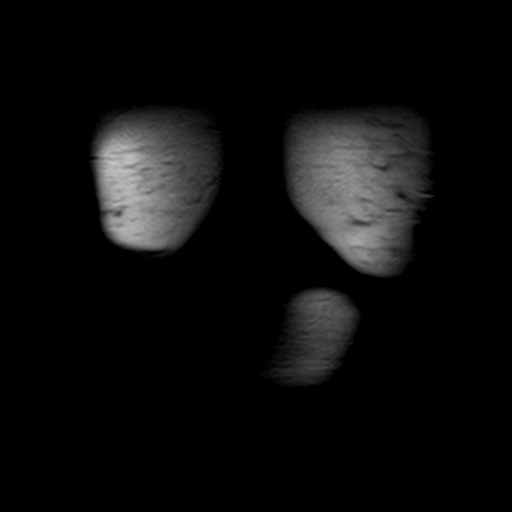

[Series 4: axial haste · axial · 6.0mm · 0.68mm/px · z∈[-85,+126]mm · 2 of 33 slices shown]
[im 1/33]
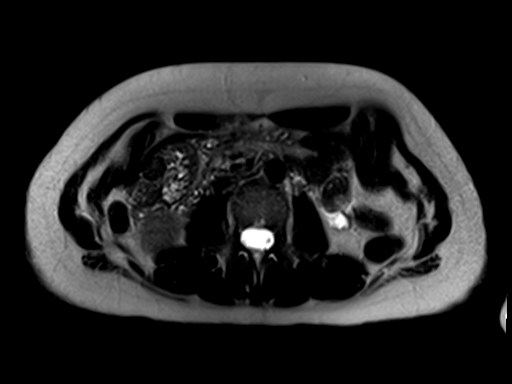
[im 33/33]
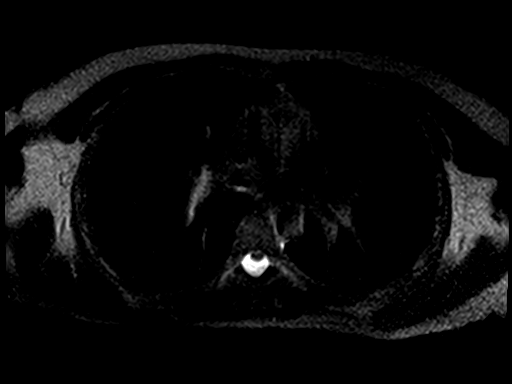

[Series 5: T1 · axial · 6.0mm · 0.68mm/px · z∈[-72,+139]mm · 4 of 66 slices shown]
[im 1/66]
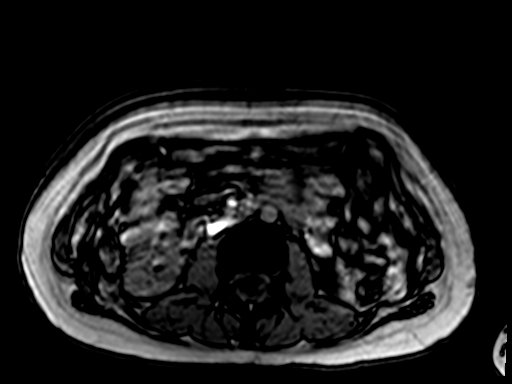
[im 22/66]
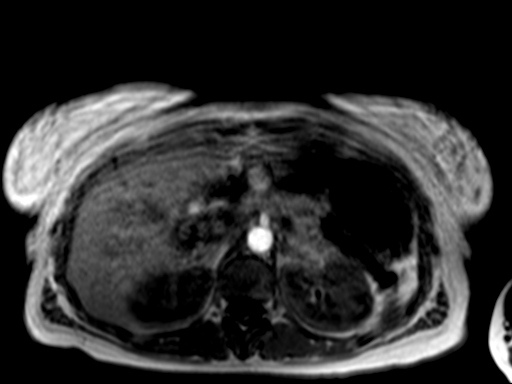
[im 44/66]
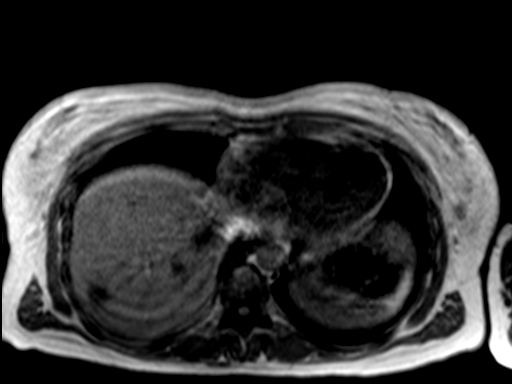
[im 66/66]
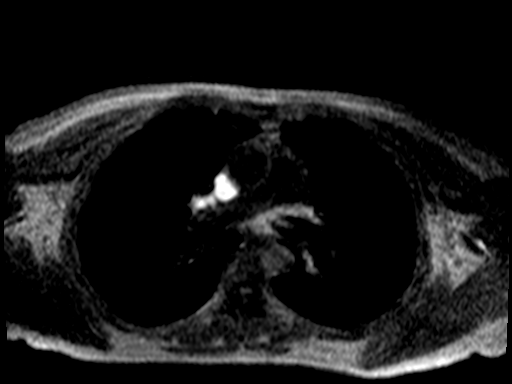

[Series 6: bSSFP · axial · 4.0mm · 0.68mm/px · z∈[-87,+153]mm · 3 of 61 slices shown]
[im 1/61]
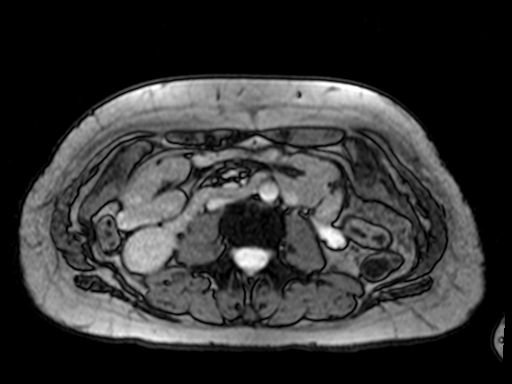
[im 31/61]
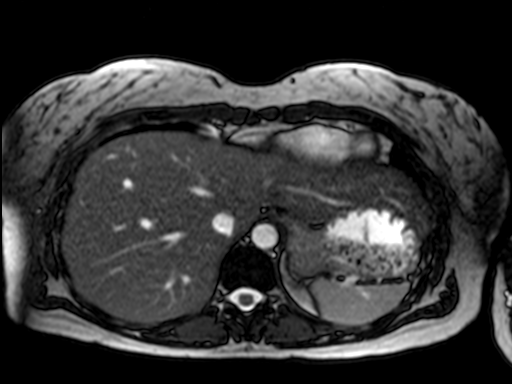
[im 61/61]
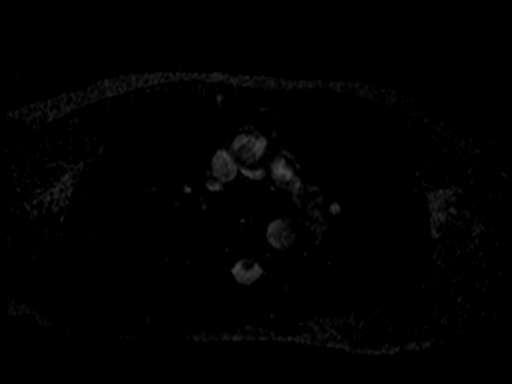

[Series 7: T2 fat-sat · axial · 6.0mm · 1.09mm/px · z∈[-82,+141]mm · 2 of 32 slices shown]
[im 1/32]
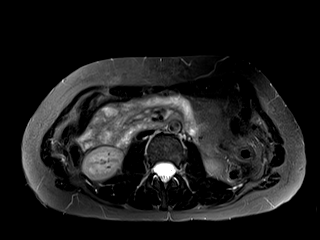
[im 32/32]
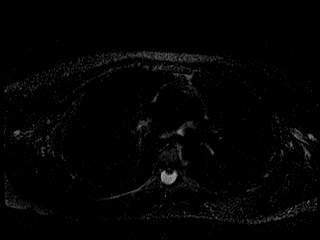

[Series 8: ep2d_[id number]_500_800_p2_trig · axial · 6.0mm · 1.82mm/px · z∈[-100,+124]mm · 4 of 95 slices shown]
[im 1/95]
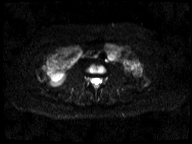
[im 32/95]
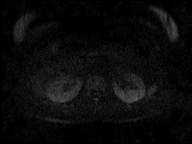
[im 63/95]
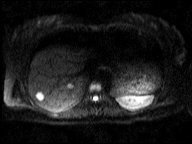
[im 95/95]
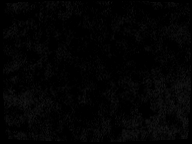

[Series 9: ep2d_[id number]_800_p2_trig_adc · axial · 6.0mm · 1.82mm/px · 1 of 32 slices shown]
[im 1/32]
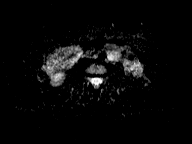

[Series 10: T1 dynamic · axial · non-contrast · 2.5mm · 0.74mm/px · z∈[-127,+91]mm · 3 of 88 slices shown]
[im 1/88]
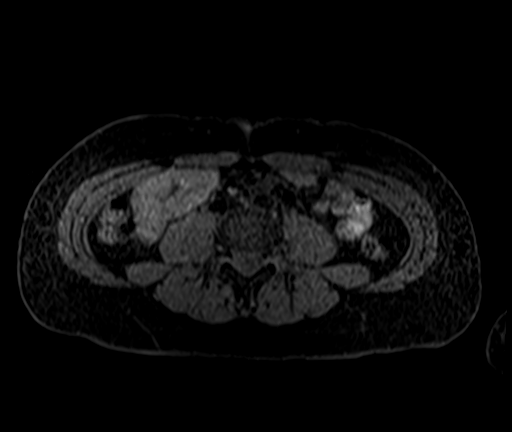
[im 44/88]
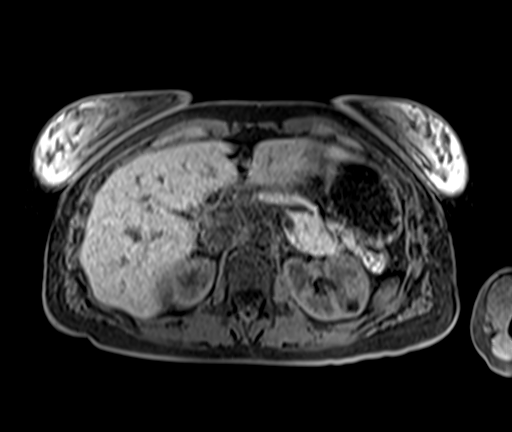
[im 88/88]
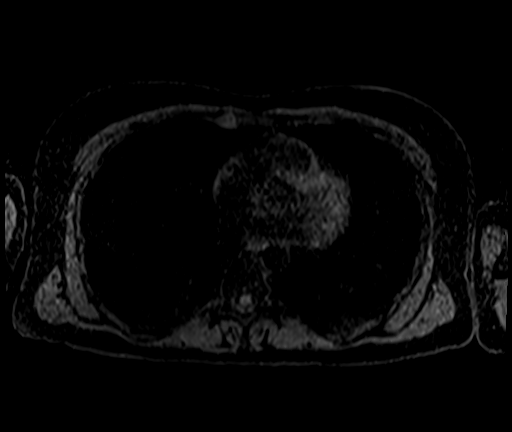

[Series 11: T1 dynamic post-contrast · axial · 2.5mm · 0.74mm/px · z∈[-127,+91]mm · 3 of 88 slices shown (1 of 3)]
[im 1/88]
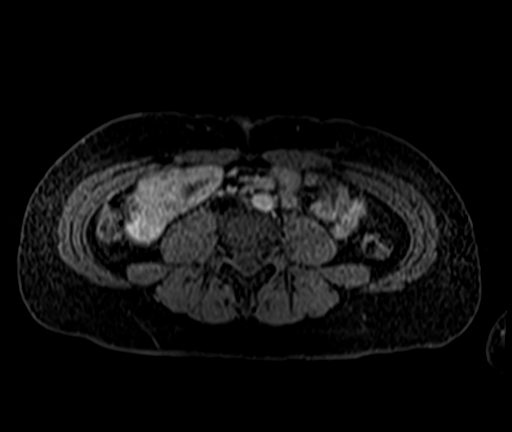
[im 44/88]
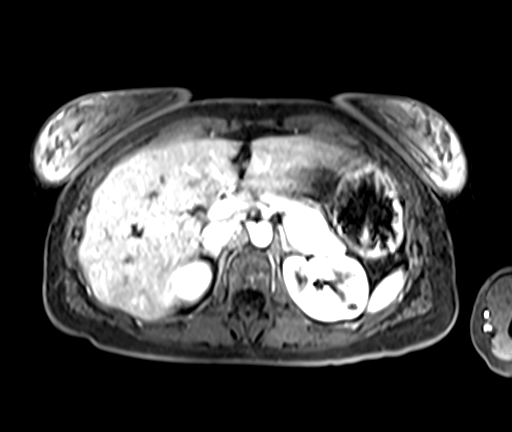
[im 88/88]
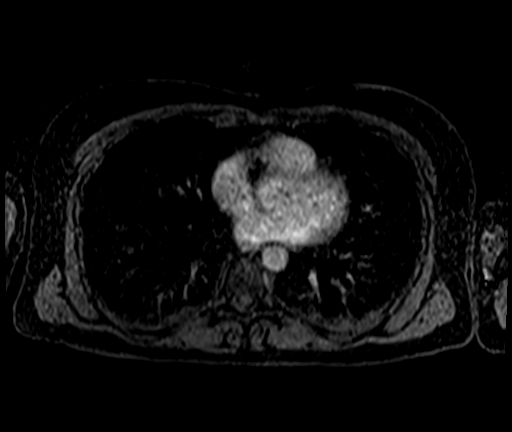

[Series 12: T1 dynamic post-contrast · axial · 2.5mm · 0.74mm/px · z∈[-127,+91]mm · 3 of 88 slices shown (2 of 3)]
[im 1/88]
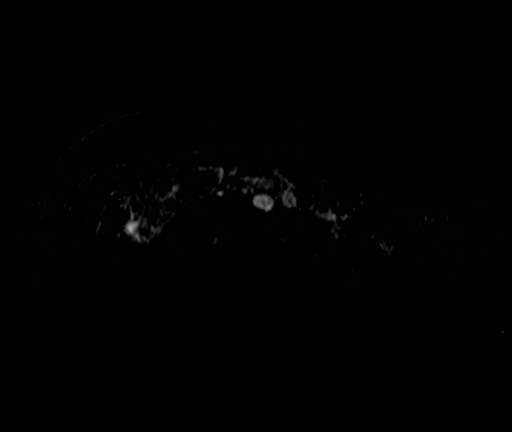
[im 44/88]
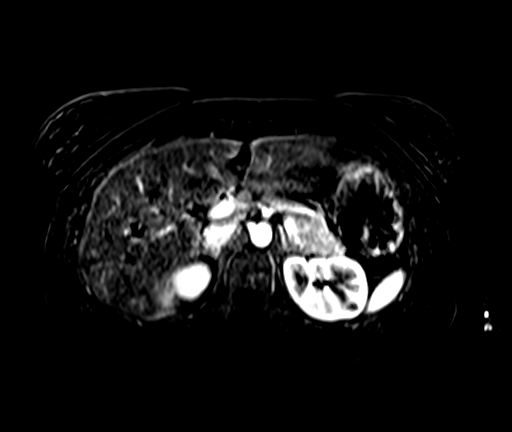
[im 88/88]
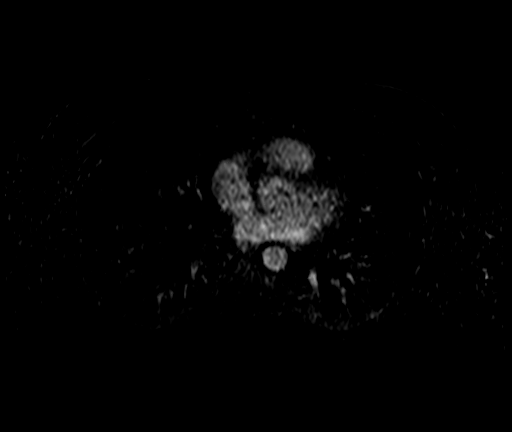

[Series 13: T1 dynamic post-contrast · axial · 2.5mm · 0.74mm/px · 1 of 88 slices shown (3 of 3)]
[im 1/88]
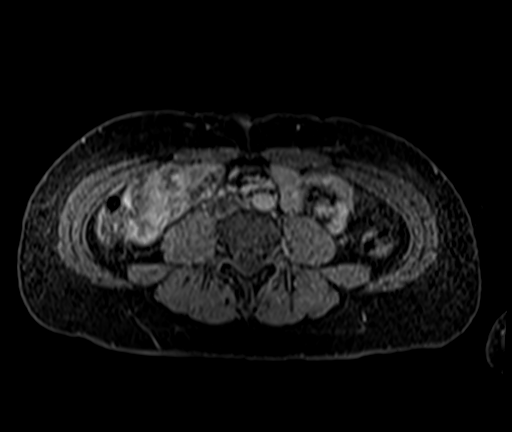

[28 of 48 positions shown; findings below may reference images not displayed]

FINDINGS: Lower chest: No acute findings.

Hepatobiliary: No hepatic steatosis. T2 hyperintense
well-circumscribed lesions in the right lobe of the liver measures
15 mm on image [DATE], 9 mm on image [DATE] and 13 mm on image [DATE],
these demonstrate peripheral nodular discontinuous enhancement with
centripetal filling in of contrast on more delayed postcontrast
imaging similar in intensity to background aorta, consistent with
benign hepatic hemangiomas. Gallbladder is unremarkable. No biliary
ductal dilation.

Pancreas: No pancreatic ductal dilation. Intrinsic T1 signal of the
pancreatic parenchyma is within normal limits. No cystic or solid
hyperenhancing pancreatic lesion identified.

Spleen:  Within normal limits in size and appearance.

Adrenals/Urinary Tract: Bilateral adrenal glands are unremarkable.
No hydronephrosis. Bilateral renal cysts. No solid enhancing renal
mass.

Stomach/Bowel: Visualized portions within the abdomen are
unremarkable.

Vascular/Lymphatic: No pathologically enlarged lymph nodes
identified. No abdominal aortic aneurysm demonstrated.

Other:  None.

Musculoskeletal: No suspicious bone lesions identified.
IMPRESSION: Enhancing T2 hyperintense hepatic lesions, corresponding with the
lesions seen on prior CT, demonstrates imaging characteristics
consistent with benign hepatic hemangiomas. No suspicious hepatic
lesion.

## 2023-03-20 ENCOUNTER — Other Ambulatory Visit (HOSPITAL_COMMUNITY): Payer: Self-pay

## 2023-04-05 ENCOUNTER — Other Ambulatory Visit: Payer: Self-pay | Admitting: Oncology

## 2023-04-05 DIAGNOSIS — Z006 Encounter for examination for normal comparison and control in clinical research program: Secondary | ICD-10-CM

## 2023-04-10 ENCOUNTER — Ambulatory Visit: Payer: Commercial Managed Care - PPO | Admitting: Obstetrics and Gynecology

## 2023-04-10 ENCOUNTER — Other Ambulatory Visit (HOSPITAL_COMMUNITY): Payer: Self-pay

## 2023-04-10 ENCOUNTER — Encounter: Payer: Self-pay | Admitting: Radiology

## 2023-04-10 ENCOUNTER — Ambulatory Visit (INDEPENDENT_AMBULATORY_CARE_PROVIDER_SITE_OTHER): Payer: 59 | Admitting: Radiology

## 2023-04-10 VITALS — BP 112/64 | Ht 66.5 in | Wt 142.0 lb

## 2023-04-10 DIAGNOSIS — Z3041 Encounter for surveillance of contraceptive pills: Secondary | ICD-10-CM | POA: Diagnosis not present

## 2023-04-10 DIAGNOSIS — Z01419 Encounter for gynecological examination (general) (routine) without abnormal findings: Secondary | ICD-10-CM

## 2023-04-10 MED ORDER — NORETHINDRONE 0.35 MG PO TABS
1.0000 | ORAL_TABLET | Freq: Every day | ORAL | 4 refills | Status: DC
  Filled 2023-04-10: qty 84, 84d supply, fill #0
  Filled 2023-06-10 – 2023-06-18 (×2): qty 84, 84d supply, fill #1
  Filled 2023-06-29 – 2023-09-21 (×2): qty 84, 84d supply, fill #2
  Filled 2023-11-11 – 2023-12-08 (×2): qty 84, 84d supply, fill #3
  Filled 2024-03-01: qty 84, 84d supply, fill #4

## 2023-04-10 NOTE — Progress Notes (Signed)
   Sue Williams 1981/07/06 [id number]   History:  42 y.o. G4P0 presents for annual exam.Doing well on POPs, up to date on screenings. Has PCP. No gyn concerns.  Gynecologic History Patient's last menstrual period was 04/10/2023 (exact date). Period Cycle (Days): 28 Period Duration (Days): 4 Period Pattern: Regular Menstrual Flow: Light, Moderate Menstrual Control: Maxi pad, Thin pad Dysmenorrhea: (!) Mild Dysmenorrhea Symptoms: Cramping Contraception/Family planning: oral progesterone-only contraceptive Sexually active: yes Last Pap: 2020. Results were: normal Last mammogram: 2024. Results were: normal  Obstetric History OB History  Gravida Para Term Preterm AB Living  4       4 0  SAB IAB Ectopic Multiple Live Births  1 3          # Outcome Date GA Lbr Len/2nd Weight Sex Type Anes PTL Lv  4 SAB 2020          3 IAB           2 IAB           1 IAB              The following portions of the patient's history were reviewed and updated as appropriate: allergies, current medications, past family history, past medical history, past social history, past surgical history, and problem list.  Review of Systems Pertinent items noted in HPI and remainder of comprehensive ROS otherwise negative.   Past medical history, past surgical history, family history and social history were all reviewed and documented in the EPIC chart.   Exam:  Vitals:   04/10/23 0855  BP: 112/64  Weight: 142 lb (64.4 kg)  Height: 5' 6.5" (1.689 m)   Body mass index is 22.58 kg/m.  General appearance:  Normal Thyroid:  Symmetrical, normal in size, without palpable masses or nodularity. Respiratory  Auscultation:  Clear without wheezing or rhonchi Cardiovascular  Auscultation:  Regular rate, without rubs, murmurs or gallops  Edema/varicosities:  Not grossly evident Abdominal  Soft,nontender, without masses, guarding or rebound.  Liver/spleen:  No organomegaly noted  Hernia:  None  appreciated  Skin  Inspection:  Grossly normal Breasts: Examined lying and sitting.   Right: Without masses, retractions, nipple discharge or axillary adenopathy.   Left: Without masses, retractions, nipple discharge or axillary adenopathy. Genitourinary   Inguinal/mons:  Normal without inguinal adenopathy  External genitalia:  Normal appearing vulva with no masses, tenderness, or lesions  BUS/Urethra/Skene's glands:  Normal without masses or exudate  Vagina:  Normal appearing with normal color and discharge, no lesions  Cervix:  Normal appearing without discharge or lesions  Uterus:  Normal in size, shape and contour.  Mobile, nontender  Adnexa/parametria:     Rt: Normal in size, without masses or tenderness.   Lt: Normal in size, without masses or tenderness.  Anus and perineum: Normal   Raynelle Fanning, CMA present for exam  Assessment/Plan:   1. Well woman exam with routine gynecological exam Pap 2025 Up to date with mammo and colonoscopy  2. Encounter for surveillance of contraceptive pills - norethindrone (NORLYDA) 0.35 MG tablet; Take 1 tablet (0.35 mg total) by mouth daily.  Dispense: 84 tablet; Refill: 4     Discussed SBE, colonoscopy and pap screening as directed/appropriate. Recommend of exercise weekly, including weight bearing exercise. Encouraged the use of seatbelts and sunscreen. Return in 1 year for annual or as needed.   Arlie Solomons B WHNP-BC 9:09 AM 04/10/2023

## 2023-06-10 ENCOUNTER — Other Ambulatory Visit (HOSPITAL_COMMUNITY): Payer: Self-pay

## 2023-06-19 ENCOUNTER — Other Ambulatory Visit (HOSPITAL_COMMUNITY): Payer: Self-pay

## 2023-06-29 ENCOUNTER — Other Ambulatory Visit (HOSPITAL_COMMUNITY): Payer: Self-pay

## 2023-06-29 ENCOUNTER — Other Ambulatory Visit: Payer: Self-pay | Admitting: Physician Assistant

## 2023-06-29 MED ORDER — GABAPENTIN 100 MG PO CAPS
100.0000 mg | ORAL_CAPSULE | Freq: Two times a day (BID) | ORAL | 1 refills | Status: DC
  Filled 2023-06-29: qty 180, 90d supply, fill #0
  Filled 2023-09-22: qty 180, 90d supply, fill #1

## 2023-06-30 ENCOUNTER — Other Ambulatory Visit: Payer: Self-pay

## 2023-07-09 ENCOUNTER — Other Ambulatory Visit (HOSPITAL_COMMUNITY)
Admission: RE | Admit: 2023-07-09 | Discharge: 2023-07-09 | Disposition: A | Discharge status: Home / Self Care | Payer: 59 | Source: Ambulatory Visit | Attending: Oncology | Admitting: Oncology

## 2023-07-09 DIAGNOSIS — Z006 Encounter for examination for normal comparison and control in clinical research program: Secondary | ICD-10-CM

## 2023-07-09 DIAGNOSIS — H524 Presbyopia: Secondary | ICD-10-CM | POA: Diagnosis not present

## 2023-07-11 ENCOUNTER — Other Ambulatory Visit: Payer: Self-pay | Admitting: Medical Genetics

## 2023-07-11 ENCOUNTER — Encounter: Payer: Self-pay | Admitting: Medical Genetics

## 2023-07-11 DIAGNOSIS — Z1379 Encounter for other screening for genetic and chromosomal anomalies: Secondary | ICD-10-CM

## 2023-07-11 HISTORY — DX: Encounter for other screening for genetic and chromosomal anomalies: Z13.79

## 2023-07-11 NOTE — Progress Notes (Signed)
Order needed for Helix testing. Confirmed consent on file.

## 2023-07-12 ENCOUNTER — Other Ambulatory Visit (HOSPITAL_COMMUNITY)
Admission: RE | Admit: 2023-07-12 | Discharge: 2023-07-12 | Disposition: A | Discharge status: Home / Self Care | Payer: 59 | Attending: Medical Genetics | Admitting: Medical Genetics

## 2023-07-12 DIAGNOSIS — Z1379 Encounter for other screening for genetic and chromosomal anomalies: Secondary | ICD-10-CM | POA: Insufficient documentation

## 2023-07-14 IMAGING — CT CT ABD-PELV W/ CM
2 of 4 series · 15 of 46 positions shown, 17 images · IV contrast (agent unspecified)
Comparison: MRI abdomen 10/04/2021

CLINICAL DATA: Abdominal pain, acute, nonlocalized

EXAM:
CT ABDOMEN AND PELVIS WITH CONTRAST
TECHNIQUE: Multidetector CT imaging of the abdomen and pelvis was performed
using the standard protocol following bolus administration of
intravenous contrast.

[Series 2: axial st · axial · 0.80mm/px · z∈[+1130,+1575]mm · 12 of 101 slices shown, 14 images]
[im 6/101  soft-tissue]
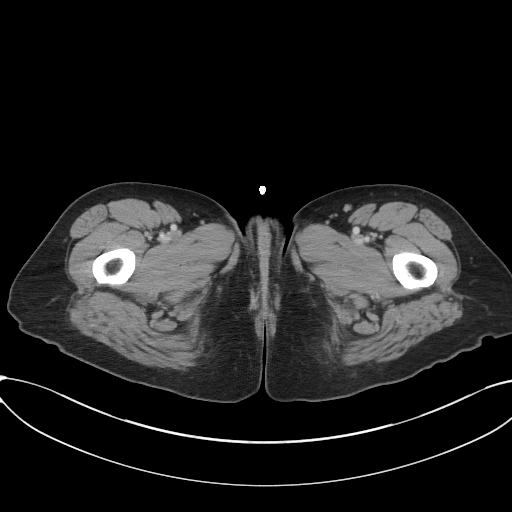
[im 6/101  bone]
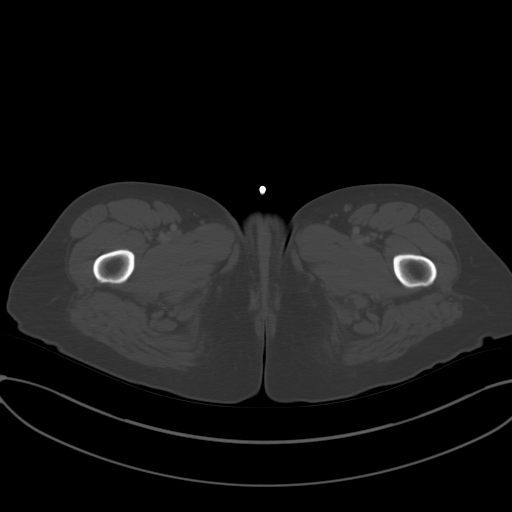
[im 18/101  soft-tissue]
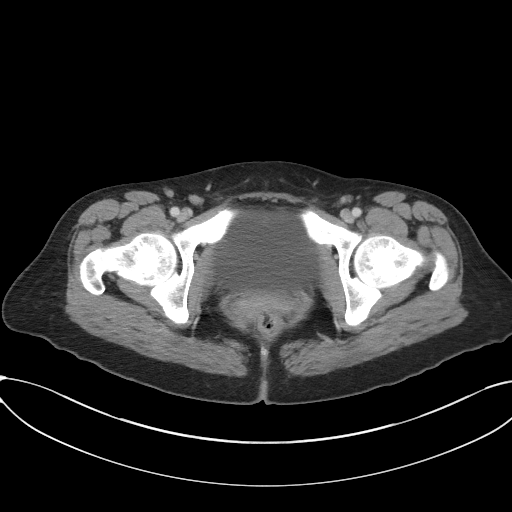
[im 24/101  soft-tissue]
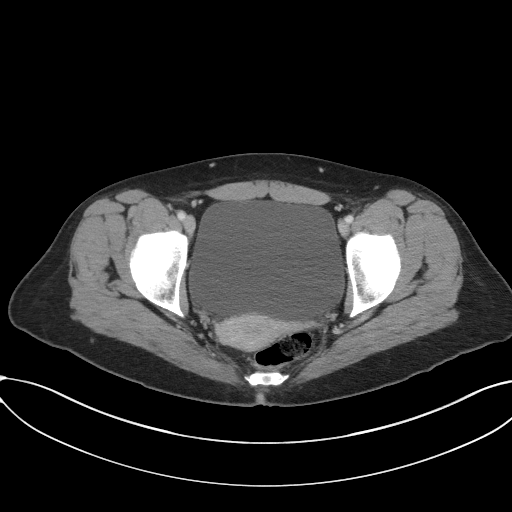
[im 30/101  soft-tissue]
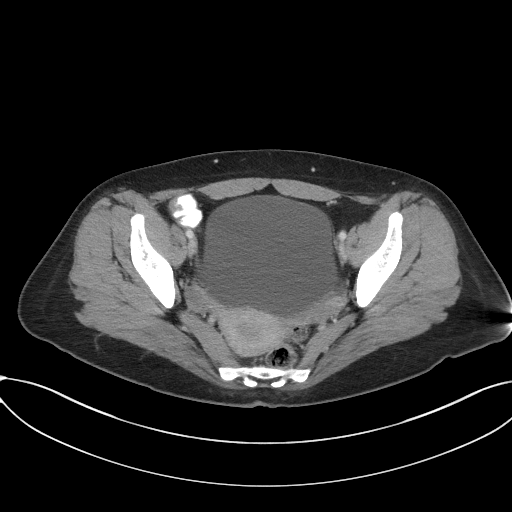
[im 42/101  soft-tissue]
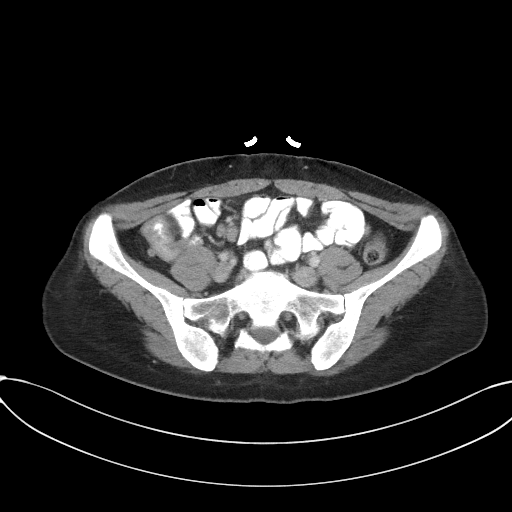
[im 48/101  soft-tissue]
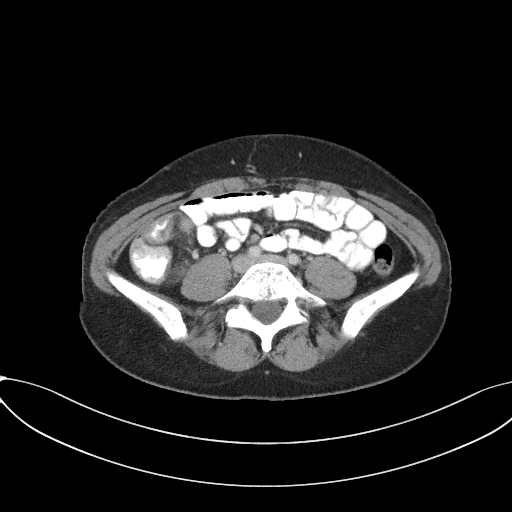
[im 53/101  soft-tissue]
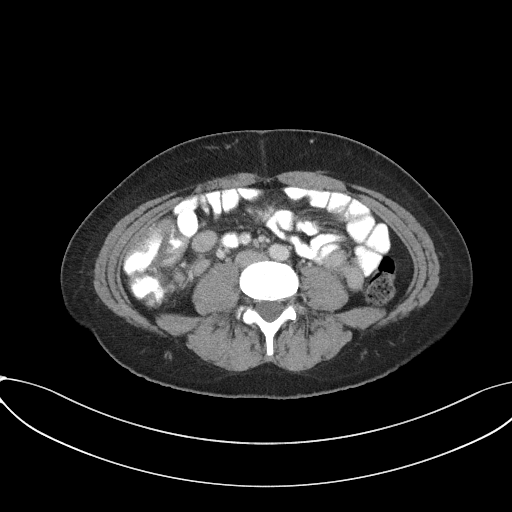
[im 65/101  soft-tissue]
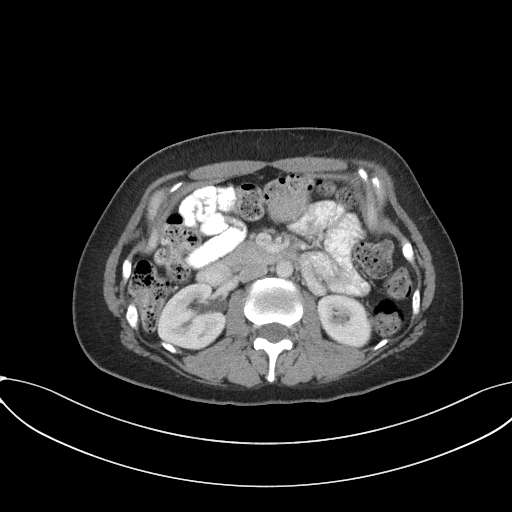
[im 71/101  soft-tissue]
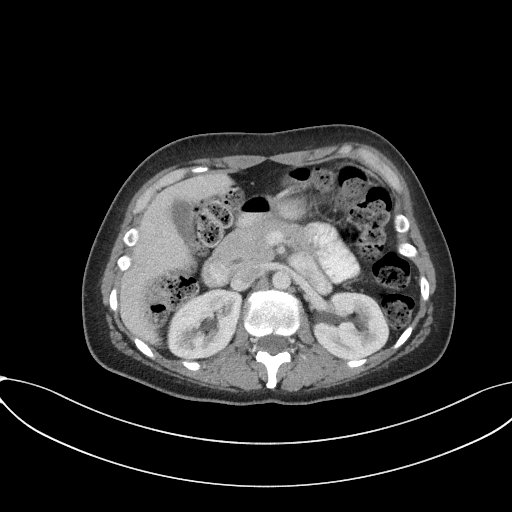
[im 71/101  bone]
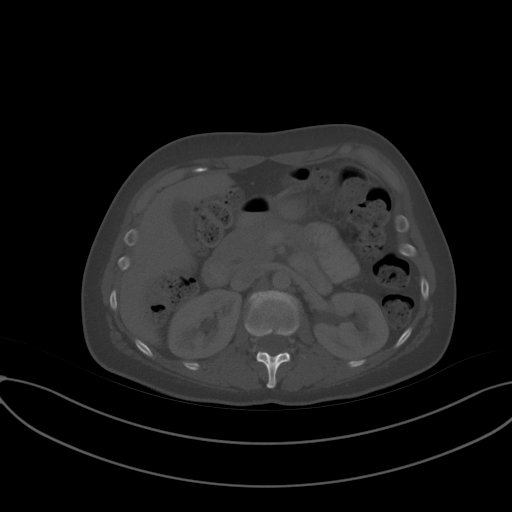
[im 77/101  soft-tissue]
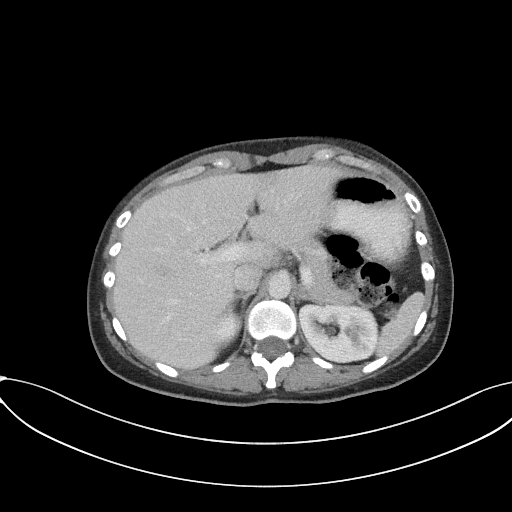
[im 89/101  soft-tissue]
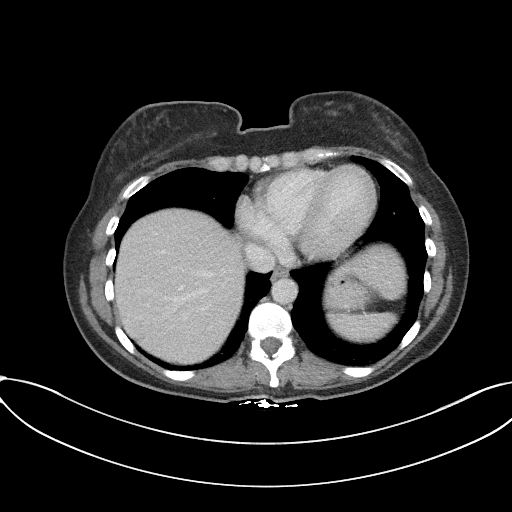
[im 95/101  soft-tissue]
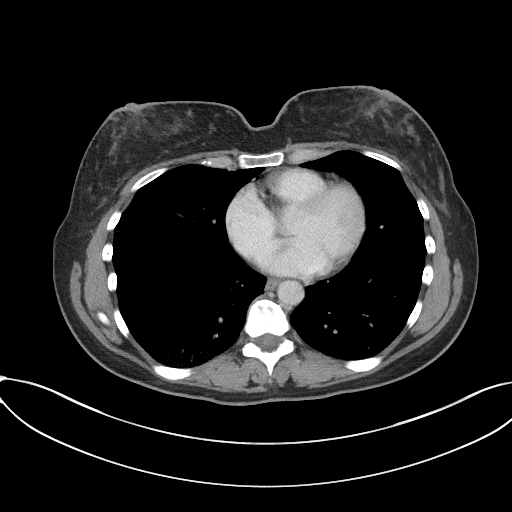

[Series 5: coronal st · coronal · 0.68mm/px · 3 of 101 slices shown]
[im 34/101  soft-tissue]
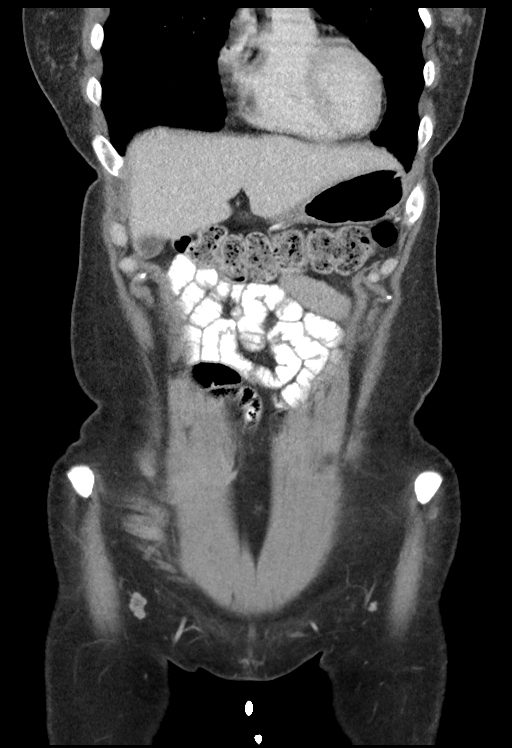
[im 45/101  soft-tissue]
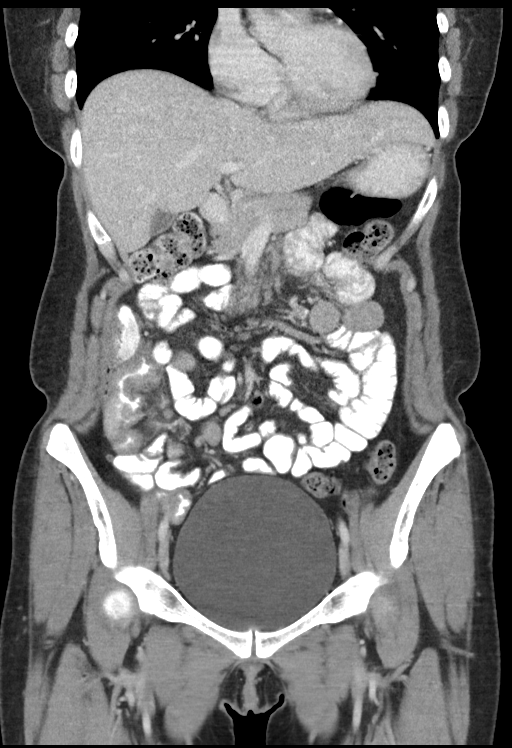
[im 56/101  soft-tissue]
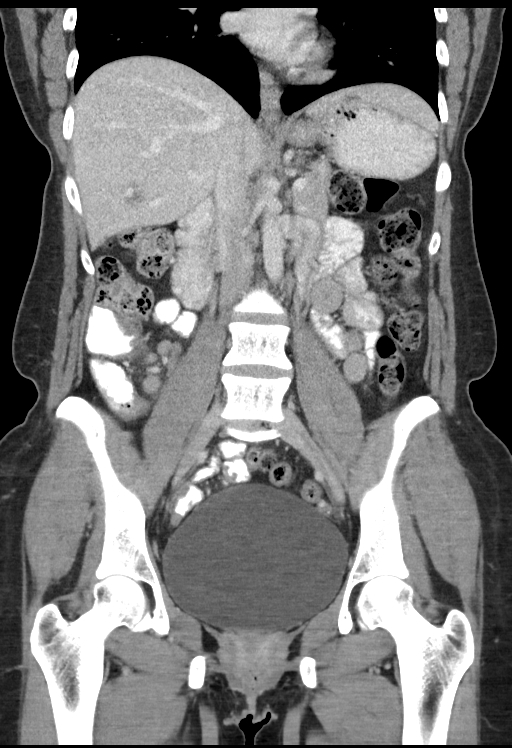

[15 of 46 positions shown; findings below may reference images not displayed]

RADIATION DOSE REDUCTION: This exam was performed according to the
departmental dose-optimization program which includes automated
exposure control, adjustment of the mA and/or kV according to
patient size and/or use of iterative reconstruction technique.

CONTRAST:  75mL OMNIPAQUE IOHEXOL 300 MG/ML  SOLN
FINDINGS: Lower chest: Bilateral lower lobe subsegmental atelectasis. No acute
abnormality.

Hepatobiliary: Total of 3 lesions that are hypodense with peripheral
discontinuous enhancement consistent with known hepatic hemangiomas
([DATE], 17, 27). No new focal liver abnormality. No gallstones,
gallbladder wall thickening, or pericholecystic fluid. No biliary
dilatation.

Pancreas: No focal lesion. Normal pancreatic contour. No surrounding
inflammatory changes. No main pancreatic ductal dilatation.

Spleen: Normal in size without focal abnormality.

Adrenals/Urinary Tract:

No adrenal nodule bilaterally.

Bilateral kidneys enhance symmetrically. Pericentimeter fluid dense
lesion within left kidney likely represents a simple renal cyst.
Simple renal cysts, in the absence of clinically indicated
signs/symptoms, require no independent follow-up.

No hydronephrosis. No hydroureter.

The urinary bladder is unremarkable.

Stomach/Bowel: PO contrast reaches the cecum. Stomach is within
normal limits. Circumferential terminal ileum bowel wall thickening
([DATE]). Stool throughout the majority of the colon. Appendix appears
normal.

Vascular/Lymphatic: No abdominal aorta or iliac aneurysm. Mild
atherosclerotic plaque of the aorta and its branches. Enlarged right
lower quadrant mesenteric lymph nodes: As an example a 1.2 cm ([DATE])
and a 1.7 cm ([DATE]). No pelvic or inguinal lymphadenopathy.

Reproductive: Juristo luteum cyst noted within the left ovary. Uterus
and bilateral adnexa are unremarkable.

Other: No intraperitoneal free fluid. No intraperitoneal free gas.
No organized fluid collection.

Musculoskeletal:

No abdominal wall hernia or abnormality.

No suspicious lytic or blastic osseous lesions. No acute displaced
fracture.
IMPRESSION: Terminal ileitis with right lower quadrant lymphadenopathy.
Differential diagnosis for etiology includes infection,
inflammation, less likely ischemia. Underlying mass lesion is not
excluded. Recommend attention on follow-up.

## 2023-07-18 ENCOUNTER — Encounter (INDEPENDENT_AMBULATORY_CARE_PROVIDER_SITE_OTHER): Payer: Self-pay

## 2023-07-22 LAB — HELIX MOLECULAR SCREEN: Genetic Analysis Overall Interpretation: NEGATIVE

## 2023-08-29 ENCOUNTER — Ambulatory Visit (INDEPENDENT_AMBULATORY_CARE_PROVIDER_SITE_OTHER): Payer: 59 | Admitting: Radiology

## 2023-08-29 ENCOUNTER — Encounter: Payer: Self-pay | Admitting: Radiology

## 2023-08-29 ENCOUNTER — Ambulatory Visit: Payer: 59 | Admitting: Radiology

## 2023-08-29 VITALS — BP 116/78 | HR 88 | Wt 143.0 lb

## 2023-08-29 DIAGNOSIS — R6882 Decreased libido: Secondary | ICD-10-CM

## 2023-08-29 DIAGNOSIS — N951 Menopausal and female climacteric states: Secondary | ICD-10-CM

## 2023-08-29 DIAGNOSIS — N912 Amenorrhea, unspecified: Secondary | ICD-10-CM

## 2023-08-29 NOTE — Progress Notes (Signed)
   Sue Williams [birth year] [id number]   History:  42 y.o. G4P0 presents with c/o of hormonal changes, difficulty sleeping, decreased libido, mood swings, trouble concentrating, cognitive changes. Worse over the past few months. Scant spotting with POPs occasionally. Was originally switch to a POP because she was smoking, has not smoked in >5 years. Open to discussing HRT. Has tried gabapentin for sleep from PCP, doesn't like the side effects.  Gynecologic History Patient's last menstrual period was 08/14/2023 (exact date).   Contraception/Family planning: oral progesterone-only contraceptive Sexually active: yes   Obstetric History OB History  Gravida Para Term Preterm AB Living  4       4 0  SAB IAB Ectopic Multiple Live Births  1 3          # Outcome Date GA Lbr Len/2nd Weight Sex Type Anes PTL Lv  4 SAB 2020          3 IAB           2 IAB           1 IAB             The following portions of the patient's history were reviewed and updated as appropriate: allergies, current medications, past family history, past medical history, past social history, past surgical history, and problem list.  ROS  Past medical history, past surgical history, family history and social history were all reviewed and documented in the EPIC chart.  Exam:  Vitals:   08/29/23 0819  BP: 116/78  Pulse: 88  SpO2: 99%  Weight: 143 lb (64.9 kg)   Body mass index is 22.74 kg/m.  Physical Exam Vitals and nursing note reviewed.  Constitutional:      Appearance: Normal appearance. She is normal weight.  Pulmonary:     Effort: Pulmonary effort is normal.  Neurological:     Mental Status: She is alert.  Psychiatric:        Mood and Affect: Mood normal.        Thought Content: Thought content normal.        Judgment: Judgment normal.    Raynelle Fanning, CMA present for exam  Assessment/Plan:   1. Low libido - Testos,Total,Free and SHBG (Female)  2. Amenorrhea - FSH - Estradiol  3.  Perimenopausal symptoms - FSH - Estradiol   Return in 2 weeks to discuss labs and HRT  Arlie Solomons B WHNP-BC 8:33 AM 08/29/2023

## 2023-09-03 LAB — TESTOS,TOTAL,FREE AND SHBG (FEMALE)
Free Testosterone: 1.4 pg/mL (ref 0.1–6.4)
Sex Hormone Binding: 40.2 nmol/L (ref 17–124)
Testosterone, Total, LC-MS-MS: 11 ng/dL (ref 2–45)

## 2023-09-03 LAB — ESTRADIOL: Estradiol: 98 pg/mL

## 2023-09-03 LAB — FOLLICLE STIMULATING HORMONE: FSH: 5.7 m[IU]/mL

## 2023-09-17 ENCOUNTER — Encounter: Payer: Self-pay | Admitting: Physician Assistant

## 2023-09-17 ENCOUNTER — Ambulatory Visit (INDEPENDENT_AMBULATORY_CARE_PROVIDER_SITE_OTHER): Payer: 59 | Admitting: Physician Assistant

## 2023-09-17 ENCOUNTER — Other Ambulatory Visit (HOSPITAL_COMMUNITY): Payer: Self-pay

## 2023-09-17 VITALS — BP 120/76 | HR 85 | Temp 97.7°F | Ht 66.5 in | Wt 142.0 lb

## 2023-09-17 DIAGNOSIS — Z0000 Encounter for general adult medical examination without abnormal findings: Secondary | ICD-10-CM | POA: Diagnosis not present

## 2023-09-17 DIAGNOSIS — F5102 Adjustment insomnia: Secondary | ICD-10-CM

## 2023-09-17 DIAGNOSIS — R0989 Other specified symptoms and signs involving the circulatory and respiratory systems: Secondary | ICD-10-CM | POA: Diagnosis not present

## 2023-09-17 LAB — CBC WITH DIFFERENTIAL/PLATELET
Basophils Absolute: 0.1 10*3/uL (ref 0.0–0.1)
Basophils Relative: 0.5 % (ref 0.0–3.0)
Eosinophils Absolute: 0.1 10*3/uL (ref 0.0–0.7)
Eosinophils Relative: 0.7 % (ref 0.0–5.0)
HCT: 44.3 % (ref 36.0–46.0)
Hemoglobin: 14.8 g/dL (ref 12.0–15.0)
Lymphocytes Relative: 24.7 % (ref 12.0–46.0)
Lymphs Abs: 2.9 10*3/uL (ref 0.7–4.0)
MCHC: 33.3 g/dL (ref 30.0–36.0)
MCV: 94.1 fL (ref 78.0–100.0)
Monocytes Absolute: 0.8 10*3/uL (ref 0.1–1.0)
Monocytes Relative: 6.5 % (ref 3.0–12.0)
Neutro Abs: 8 10*3/uL — ABNORMAL HIGH (ref 1.4–7.7)
Neutrophils Relative %: 67.6 % (ref 43.0–77.0)
Platelets: 361 10*3/uL (ref 150.0–400.0)
RBC: 4.71 Mil/uL (ref 3.87–5.11)
RDW: 13.5 % (ref 11.5–15.5)
WBC: 11.9 10*3/uL — ABNORMAL HIGH (ref 4.0–10.5)

## 2023-09-17 LAB — COMPREHENSIVE METABOLIC PANEL
ALT: 12 U/L (ref 0–35)
AST: 16 U/L (ref 0–37)
Albumin: 4.4 g/dL (ref 3.5–5.2)
Alkaline Phosphatase: 61 U/L (ref 39–117)
BUN: 12 mg/dL (ref 6–23)
CO2: 29 meq/L (ref 19–32)
Calcium: 9.2 mg/dL (ref 8.4–10.5)
Chloride: 105 meq/L (ref 96–112)
Creatinine, Ser: 0.55 mg/dL (ref 0.40–1.20)
GFR: 112.18 mL/min (ref >60.00)
Glucose, Bld: 82 mg/dL (ref 70–99)
Potassium: 3.7 meq/L (ref 3.5–5.1)
Sodium: 140 meq/L (ref 135–145)
Total Bilirubin: 1.1 mg/dL (ref 0.2–1.2)
Total Protein: 6.5 g/dL (ref 6.0–8.3)

## 2023-09-17 LAB — LIPID PANEL
Cholesterol: 183 mg/dL (ref 0–200)
HDL: 66.9 mg/dL (ref >39.00)
LDL Cholesterol: 104 mg/dL — ABNORMAL HIGH (ref 0–99)
NonHDL: 116.39
Total CHOL/HDL Ratio: 3
Triglycerides: 64 mg/dL (ref 0.0–149.0)
VLDL: 12.8 mg/dL (ref 0.0–40.0)

## 2023-09-17 MED ORDER — PANTOPRAZOLE SODIUM 40 MG PO TBEC
40.0000 mg | DELAYED_RELEASE_TABLET | Freq: Two times a day (BID) | ORAL | 3 refills | Status: DC
  Filled 2023-09-17: qty 60, 30d supply, fill #0
  Filled 2023-10-12: qty 60, 30d supply, fill #1
  Filled 2023-11-11: qty 60, 30d supply, fill #2
  Filled 2023-12-11: qty 60, 30d supply, fill #3

## 2023-09-17 NOTE — Patient Instructions (Signed)
It was great to see you!  Follow-up with [facility] -- [phone] Dr Tiajuana Amass did your colonoscopy but you can see any provider  Start protonix 40 mg twice daily   Let's follow-up in 1 year, sooner if you have concerns.  Take care,  Jarold Motto PA-C

## 2023-09-17 NOTE — Progress Notes (Signed)
Subjective:    Sue Williams is a 42 y.o. female and is here for a comprehensive physical exam.  HPI  There are no preventive care reminders to display for this patient.   Acute Concerns: Upper GI sx: Pt complains of throat tightening while eating, belching, and occasional vomiting. Reports choking and while lying down has had to sit up.  Has taken Prilosec OTC to help sx.  She is unaware of any foods that trigger these symptoms.  Has tried avoiding acidic foods and limiting her carb intake.  Denies any heartburn.   Chronic Issues: Insomnia: Compliant with gabapentin 200 mg as needed.  Pt reports no new concerns.   Health Maintenance: Immunizations -- UTD.  Colonoscopy -- UTD, last done 03/31/22. Results were normal. Next due 03/2032.  Mammogram -- UTD, done on 11/06/22. Results were normal.  PAP -- 12/05/2018. Results were negative HPV Bone Density -- N/a Diet -- Consistent healthy diet (eggs, chicken, rice, vegetables).  Exercise -- Boxing and weight lifting.   Sleep habits -- See above.  Mood -- Stable.   UTD with dentist? - yes UTD with eye doctor? - yes  Weight history: Wt Readings from Last 10 Encounters:  09/17/23 142 lb (64.4 kg)  08/29/23 143 lb (64.9 kg)  04/10/23 142 lb (64.4 kg)  09/14/22 139 lb 8 oz (63.3 kg)  04/04/22 136 lb (61.7 kg)  03/31/22 137 lb (62.1 kg)  03/17/22 137 lb 2 oz (62.2 kg)  03/14/22 135 lb 6 oz (61.4 kg)  12/03/21 138 lb 14.2 oz (63 kg)  09/07/21 139 lb (63 kg)   Body mass index is 22.58 kg/m. Patient's last menstrual period was 09/07/2023 (exact date).  Alcohol use:  reports current alcohol use.  Tobacco use:  Tobacco Use: Medium Risk (09/17/2023)   Patient History    Smoking Tobacco Use: Former    Smokeless Tobacco Use: Never    Passive Exposure: Not on file   Eligible for lung cancer screening? no     09/17/2023    8:59 AM  Depression screen PHQ 2/9  Decreased Interest 0  Down, Depressed, Hopeless 0  PHQ -  2 Score 0  Altered sleeping 2  Tired, decreased energy 1  Change in appetite 0  Feeling bad or failure about yourself  0  Trouble concentrating 0  Moving slowly or fidgety/restless 0  Suicidal thoughts 0  PHQ-9 Score 3  Difficult doing work/chores Not difficult at all     Other providers/specialists: Patient Care Team: Jarold Motto, [place] as PCP - General (Physician Assistant) Tanda Rockers, NP as Nurse Practitioner (Obstetrics and Gynecology)    PMHx, SurgHx, SocialHx, Medications, and Allergies were reviewed in the Visit Navigator and updated as appropriate.   Past Medical History:  Diagnosis Date   Allergy    Anxiety    Depression    Genetic testing of female 07/11/2023   Sebaceous cyst of skin of left breast    around areola   Urinary frequency      Past Surgical History:  Procedure Laterality Date   BREAST CYST EXCISION Left 12/10/2014   Procedure: CYST EXCISION LEFT BREAST AEREOLA;  Surgeon: Almond Lint, MD;  Location: [facility];  Service: General;  Laterality: Left;   NASAL SINUS SURGERY  09/25/2006   TONSILLECTOMY     WISDOM TOOTH EXTRACTION       Family History  Problem Relation Age of Onset   COPD Mother    Dementia Mother  Heart disease Father    Diabetes Maternal Grandmother    Heart disease Maternal Grandfather    Colon cancer Neg Hx    Esophageal cancer Neg Hx    Rectal cancer Neg Hx    Stomach cancer Neg Hx    Liver cancer Neg Hx    Pancreatic cancer Neg Hx     Social History   Tobacco Use   Smoking status: Former    Current packs/day: 0.50    Average packs/day: 0.5 packs/day for 10.0 years (5.0 ttl pk-yrs)    Types: Cigarettes   Smokeless tobacco: Never   Tobacco comments:    occ  Vaping Use   Vaping status: Never Used  Substance Use Topics   Alcohol use: Yes    Comment: very rare; one drink every few months   Drug use: No    Review of Systems:   Review of Systems  Constitutional:  Negative for  chills, fever, malaise/fatigue and weight loss.  HENT:  Negative for hearing loss, sinus pain and sore throat.   Respiratory:  Negative for cough and hemoptysis.   Cardiovascular:  Negative for chest pain, palpitations, leg swelling and PND.  Gastrointestinal:  Negative for abdominal pain, constipation, diarrhea, heartburn, nausea and vomiting.  Genitourinary:  Negative for dysuria, frequency and urgency.  Musculoskeletal:  Negative for back pain, myalgias and neck pain.  Skin:  Negative for itching and rash.  Neurological:  Negative for dizziness, tingling, seizures and headaches.  Endo/Heme/Allergies:  Negative for polydipsia.  Psychiatric/Behavioral:  Negative for depression. The patient is not nervous/anxious.       Objective:   BP 120/76 (BP Location: Left Arm, Patient Position: Sitting, Cuff Size: Normal)   Pulse 85   Temp 97.7 F (36.5 C) (Temporal)   Ht 5' 6.5" (1.689 m)   Wt 142 lb (64.4 kg)   LMP 09/07/2023 (Exact Date)   SpO2 99%   BMI 22.58 kg/m  Body mass index is 22.58 kg/m.   General Appearance:    Alert, cooperative, no distress, appears stated age  Head:    Normocephalic, without obvious abnormality, atraumatic  Eyes:    PERRL, conjunctiva/corneas clear, EOM's intact, fundi    benign, both eyes  Ears:    Normal TM's and external ear canals, both ears  Nose:   Nares normal, septum midline, mucosa normal, no drainage    or sinus tenderness  Throat:   Lips, mucosa, and tongue normal; teeth and gums normal  Neck:   Supple, symmetrical, trachea midline, no adenopathy;    thyroid:  no enlargement/tenderness/nodules; no carotid   bruit or JVD  Back:     Symmetric, no curvature, ROM normal, no CVA tenderness  Lungs:     Clear to auscultation bilaterally, respirations unlabored  Chest Wall:    No tenderness or deformity   Heart:    Regular rate and rhythm, S1 and S2 normal, no murmur, rub or gallop  Breast Exam:    Deferred  Abdomen:     Soft, non-tender, bowel  sounds active all four quadrants,    no masses, no organomegaly  Genitalia:    Deferred  Extremities:   Extremities normal, atraumatic, no cyanosis or edema  Pulses:   2+ and symmetric all extremities  Skin:   Skin color, texture, turgor normal, no rashes or lesions  Lymph nodes:   Cervical, supraclavicular, and axillary nodes normal  Neurologic:   CNII-XII intact, normal strength, sensation and reflexes    throughout    Assessment/Plan:  Routine physical examination Today patient counseled on age appropriate routine health concerns for screening and prevention, each reviewed and up to date or declined. Immunizations reviewed and up to date or declined. Labs ordered and reviewed. Risk factors for depression reviewed and negative. Hearing function and visual acuity are intact. ADLs screened and addressed as needed. Functional ability and level of safety reviewed and appropriate. Education, counseling and referrals performed based on assessed risks today. Patient provided with a copy of personalized plan for preventive services.  Choking sensation Referral to [facility]  Start protonix 40 mg twice daily x 1 month and then daily thereafter If severe symptom(s), needs to go to the emergency room   Adjustment insomnia Overall stable per patient Continue gabapentin 200 mg as needed as needed for sleep   I, Isabelle Course, acting as a Neurosurgeon for Jarold Motto, [place]., have documented all relevant documentation on the behalf of Jarold Motto, [place], as directed by  Jarold Motto, PA while in the presence of Jarold Motto, [place].  I, Jarold Motto, [place], have reviewed all documentation for this visit. The documentation on 09/17/23 for the exam, diagnosis, procedures, and orders are all accurate and complete.   Jarold Motto, PA-C [facility]

## 2023-09-18 ENCOUNTER — Other Ambulatory Visit: Payer: Self-pay | Admitting: Physician Assistant

## 2023-09-18 DIAGNOSIS — D72829 Elevated white blood cell count, unspecified: Secondary | ICD-10-CM

## 2023-09-21 ENCOUNTER — Encounter: Payer: Self-pay | Admitting: Radiology

## 2023-09-21 ENCOUNTER — Ambulatory Visit (INDEPENDENT_AMBULATORY_CARE_PROVIDER_SITE_OTHER): Payer: 59 | Admitting: Radiology

## 2023-09-21 VITALS — BP 110/70 | HR 90 | Wt 142.0 lb

## 2023-09-21 DIAGNOSIS — R6882 Decreased libido: Secondary | ICD-10-CM

## 2023-09-21 MED ORDER — NONFORMULARY OR COMPOUNDED ITEM: 5 refills | Status: DC

## 2023-09-21 NOTE — Progress Notes (Signed)
   Sue Williams 04-25-1981 [id number]   History:  42 y.o. G4P0 presents to discuss lab results done at last visit. She c/o of hormonal changes, difficulty sleeping, decreased libido, mood swings, trouble concentrating, cognitive changes. Worse over the past few months. Scant spotting with POPs occasionally. Was originally switch to a POP because she was smoking, has not smoked in >5 years. Open to discussing HRT. Has tried gabapentin for sleep from PCP, doesn't like the side effects.   Gynecologic History Patient's last menstrual period was 09/07/2023 (exact date).     Obstetric History OB History  Gravida Para Term Preterm AB Living  4    4 0  SAB IAB Ectopic Multiple Live Births  1 3       # Outcome Date GA Lbr Len/2nd Weight Sex Type Anes PTL Lv  4 SAB 2020          3 IAB           2 IAB           1 IAB             The following portions of the patient's history were reviewed and updated as appropriate: allergies, current medications, past family history, past medical history, past social history, past surgical history, and problem list.  Review of Systems  All other systems reviewed and are negative.   Past medical history, past surgical history, family history and social history were all reviewed and documented in the EPIC chart.  Exam:  Vitals:   09/21/23 1056  BP: 110/70  Pulse: 90  SpO2: 99%  Weight: 142 lb (64.4 kg)   Body mass index is 22.58 kg/m.  Physical Exam Vitals and nursing note reviewed.  Constitutional:      Appearance: Normal appearance.  Pulmonary:     Effort: Pulmonary effort is normal.  Neurological:     Mental Status: She is alert.  Psychiatric:        Mood and Affect: Mood normal.        Thought Content: Thought content normal.        Judgment: Judgment normal.      Assessment/Plan:   1. Low libido (Primary) Will begin compounded Testosterone PLO gel 4% a pea sized amount to inner thigh nightly. Rx called to [facility] by Raynelle Fanning, CMA Follow up 3 months Will consider adding estrogen and progesterone at that point if other symptoms have not improved.   Return in about 3 months (around 12/20/2023) for Med Follow-up.  Arlie Solomons B WHNP-BC 11:08 AM 09/21/2023

## 2023-10-11 ENCOUNTER — Encounter: Payer: Self-pay | Admitting: Gastroenterology

## 2023-10-11 ENCOUNTER — Ambulatory Visit: Payer: 59 | Admitting: Gastroenterology

## 2023-10-11 VITALS — BP 130/90 | HR 77 | Ht 66.0 in | Wt 143.0 lb

## 2023-10-11 DIAGNOSIS — R112 Nausea with vomiting, unspecified: Secondary | ICD-10-CM

## 2023-10-11 DIAGNOSIS — K529 Noninfective gastroenteritis and colitis, unspecified: Secondary | ICD-10-CM

## 2023-10-11 DIAGNOSIS — R1013 Epigastric pain: Secondary | ICD-10-CM | POA: Diagnosis not present

## 2023-10-11 DIAGNOSIS — R1310 Dysphagia, unspecified: Secondary | ICD-10-CM

## 2023-10-11 DIAGNOSIS — R1110 Vomiting, unspecified: Secondary | ICD-10-CM

## 2023-10-11 NOTE — Progress Notes (Signed)
Chief Complaint: Upper GI symptoms Primary GI MD: Dr. Tomasa Rand  HPI: 43 year old female with medical history as listed below presents for evaluation of epigastric pain and dysphagia  Patient last seen June 2023 by Dr. Tomasa Rand without normal CT scan and abdominal pain.  CT scan showed thickening of the terminal ileum and several enlarged lymph nodes in the right lower quadrant.  She was given Cipro/Flagyl.  She underwent colonoscopy to rule out infectious ileitis versus Crohn's disease.  Colonoscopy was normal and etiology was thought to be infectious ileitis which had resolved at the time of colonoscopy.  The patient, with a history of a normal colonoscopy, presents with a several month history of upper gastrointestinal symptoms. She reports frequent belching and a sensation of air accumulation, which she has been unable to attribute to any specific dietary triggers. This has been associated with a sensation of throat swelling and difficulty swallowing, to the point where she has feared throat closure and it gives her anxiety. The dysphagia is not specific to any type of food or liquid and is felt in the upper sternal area.  She is unsure if it is worse with food or liquid.  In addition to these symptoms, the patient has experienced episodes of regurgitation with a sour taste, suggestive of bile. She also reports upper epigastric discomfort, which does not appear to worsen with eating but is associated with episodes of belching.  The patient has tried various antacids without significant relief. She was prescribed Protonix 40 Mg twice daily, which has provided some improvement but has not completely resolved the symptoms. The patient denies any changes in bowel habits, rectal bleeding, black stools, or unplanned weight loss. She also denies regular use of NSAIDs.  The patient acknowledges being under stress due to caring for her mother, whose health has been declining over the past year or  two. Despite this, she reports maintaining a healthy lifestyle with regular exercise and a balanced diet.      PREVIOUS GI WORKUP   Colonoscopy 03/31/2022 for abnormal GI tract showing ileal thickening - Colon normal - Terminal ileum normal - Perianal and digital rectal exam normal - Repeat 10 years  Past Medical History:  Diagnosis Date   Allergy    Anxiety    Depression    Genetic testing of female 07/11/2023   Sebaceous cyst of skin of left breast    around areola   Urinary frequency     Past Surgical History:  Procedure Laterality Date   BREAST CYST EXCISION Left 12/10/2014   Procedure: CYST EXCISION LEFT BREAST AEREOLA;  Surgeon: Almond Lint, MD;  Location: [facility];  Service: General;  Laterality: Left;   NASAL SINUS SURGERY  09/25/2006   TONSILLECTOMY     WISDOM TOOTH EXTRACTION      Current Outpatient Medications  Medication Sig Dispense Refill   CALCIUM-MAGNESIUM-ZINC PO Take 1 capsule by mouth daily in the afternoon.     gabapentin (NEURONTIN) 100 MG capsule Take 1 capsule (100 mg total) by mouth 2 (two) times daily. 180 capsule 1   Multiple Vitamin (MULTIVITAMIN) tablet Take 1 tablet by mouth daily.     NONFORMULARY OR COMPOUNDED ITEM Testosterone POL GEL or Anhydrous versabase 4%.  Appy pea sized amount to inner thigh qhs 30 each 5   norethindrone (NORLYDA) 0.35 MG tablet Take 1 tablet (0.35 mg total) by mouth daily. 84 tablet 4   pantoprazole (PROTONIX) 40 MG tablet Take 1 tablet (40 mg total) by mouth  2 (two) times daily. 60 tablet 3   Probiotic Product (PROBIOTIC PO) Take 1 capsule by mouth daily in the afternoon.     TURMERIC PO Take 1,500 mg by mouth daily in the afternoon.     No current facility-administered medications for this visit.    Allergies as of 10/11/2023 - Review Complete 10/11/2023  Allergen Reaction Noted   Meperidine hcl Palpitations 03/31/2022   Codeine Nausea And Vomiting 12/02/2014   Demerol [meperidine] Nausea And  Vomiting 12/02/2014    Family History  Problem Relation Age of Onset   COPD Mother    Dementia Mother    Heart disease Father    Diabetes Maternal Grandmother    Heart disease Maternal Grandfather    Colon cancer Neg Hx    Esophageal cancer Neg Hx    Liver cancer Neg Hx     Social History   Socioeconomic History   Marital status: Single    Spouse name: Not on file   Number of children: 0   Years of education: Not on file   Highest education level: Not on file  Occupational History   Not on file  Tobacco Use   Smoking status: Former    Current packs/day: 0.50    Average packs/day: 0.5 packs/day for 10.0 years (5.0 ttl pk-yrs)    Types: Cigarettes   Smokeless tobacco: Never   Tobacco comments:    occ  Vaping Use   Vaping status: Never Used  Substance and Sexual Activity   Alcohol use: Yes    Comment: very rare; one drink every few months   Drug use: No   Sexual activity: Yes    Partners: Male    Birth control/protection: Pill    Comment: last menstrual cycle 03/20/22 pt reported.  signed waiver.  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 [employer]   Lives with boyfriend   Social Drivers of Corporate investment banker Strain: Not on file  Food Insecurity: Not on file  Transportation Needs: Not on file  Physical Activity: Not on file  Stress: Not on file  Social Connections: Not on file  Intimate Partner Violence: Not on file    Review of Systems:    Constitutional: No weight loss, fever, chills, weakness or fatigue HEENT: Eyes: No change in vision               Ears, Nose, Throat:  No change in hearing or congestion Skin: No rash or itching Cardiovascular: No chest pain, chest pressure or palpitations   Respiratory: No SOB or cough Gastrointestinal: See HPI and otherwise negative Genitourinary: No dysuria or change in urinary frequency Neurological: No headache, dizziness or  syncope Musculoskeletal: No new muscle or joint pain Hematologic: No bleeding or bruising Psychiatric: No history of depression or anxiety    Physical Exam:  Vital signs: BP (!) 130/90   Pulse 77   Ht 5\' 6"  (1.676 m)   Wt 143 lb (64.9 kg)   LMP 09/07/2023 (Exact Date)   BMI 23.08 kg/m   Constitutional: NAD, Well developed, Well nourished, alert and cooperative Head:  Normocephalic and atraumatic. Eyes:   PEERL, EOMI. No icterus. Conjunctiva pink. Respiratory: Respirations even and unlabored. Lungs clear to auscultation bilaterally.   No wheezes, crackles, or rhonchi.  Cardiovascular:  Regular rate and rhythm. No peripheral edema, cyanosis or pallor.  Gastrointestinal:  Soft, nondistended, nontender. No rebound or  guarding. Normal bowel sounds. No appreciable masses or hepatomegaly. Rectal:  Not performed.  Msk:  Symmetrical without gross deformities. Without edema, no deformity or joint abnormality.  Neurologic:  Alert and  oriented x4;  grossly normal neurologically.  Skin:   Dry and intact without significant lesions or rashes. Psychiatric: Oriented to person, place and time. Demonstrates good judgement and reason without abnormal affect or behaviors.   RELEVANT LABS AND IMAGING: CBC    Component Value Date/Time   WBC 11.9 (H) 09/17/2023 0934   RBC 4.71 09/17/2023 0934   HGB 14.8 09/17/2023 0934   HGB 13.5 12/17/2019 1315   HGB 14.3 10/01/2014 1458   HCT 44.3 09/17/2023 0934   HCT 40.8 12/17/2019 1315   PLT 361.0 09/17/2023 0934   PLT 344 12/17/2019 1315   MCV 94.1 09/17/2023 0934   MCV 94 12/17/2019 1315   MCH 31.3 12/17/2019 1315   MCHC 33.3 09/17/2023 0934   RDW 13.5 09/17/2023 0934   RDW 12.7 12/17/2019 1315   LYMPHSABS 2.9 09/17/2023 0934   MONOABS 0.8 09/17/2023 0934   EOSABS 0.1 09/17/2023 0934   BASOSABS 0.1 09/17/2023 0934    CMP     Component Value Date/Time   NA 140 09/17/2023 0934   NA 140 12/09/2019 1356   K 3.7 09/17/2023 0934   CL 105  09/17/2023 0934   CO2 29 09/17/2023 0934   GLUCOSE 82 09/17/2023 0934   BUN 12 09/17/2023 0934   BUN 14 12/09/2019 1356   CREATININE 0.55 09/17/2023 0934   CALCIUM 9.2 09/17/2023 0934   PROT 6.5 09/17/2023 0934   PROT 6.3 12/09/2019 1356   ALBUMIN 4.4 09/17/2023 0934   ALBUMIN 4.4 12/09/2019 1356   AST 16 09/17/2023 0934   ALT 12 09/17/2023 0934   ALKPHOS 61 09/17/2023 0934   BILITOT 1.1 09/17/2023 0934   BILITOT 0.6 12/09/2019 1356   GFRNONAA 113 12/09/2019 1356   GFRAA 131 12/09/2019 1356     Assessment/Plan:      Dyspepsia Dysphagia Epigastric pain New onset of belching, regurgitation, and dysphagia with sensation of throat swelling.  Minimal relief with Protonix twice daily.  No NSAID use.  DDx includes gastritis, esophagitis, PUD, stricture. -EGD with possible dilation for further evaluation - I thoroughly discussed the procedure with the patient (at bedside) to include nature of the procedure, alternatives, benefits, and risks (including but not limited to bleeding, infection, perforation, anesthesia/cardiac pulmonary complications).  Patient verbalized understanding and gave verbal consent to proceed with procedure. - Pending endoscopic evaluation results will consider changing Protonix to another PPI and addition of Carafate.  Ileitis on CT 2023 Normal colonoscopy results in 2023, next due in 2033. No current colorectal symptoms. -Continue current follow-up plan.      Lara Mulch [facility] 10/11/2023, 8:57 AM  Cc: Jarold Motto, [place]

## 2023-10-11 NOTE — Patient Instructions (Signed)
You have been scheduled for an endoscopy. Please follow written instructions given to you at your visit today.  If you use inhalers (even only as needed), please bring them with you on the day of your procedure.  If you take any of the following medications, they will need to be adjusted prior to your procedure:   DO NOT TAKE 7 DAYS PRIOR TO TEST- Trulicity (dulaglutide) Ozempic, Wegovy (semaglutide) Mounjaro (tirzepatide) Bydureon Bcise (exanatide extended release)  DO NOT TAKE 1 DAY PRIOR TO YOUR TEST Rybelsus (semaglutide) Adlyxin (lixisenatide) Victoza (liraglutide) Byetta (exanatide) ___________________________________________________________________________   Due to recent changes in healthcare laws, you may see the results of your imaging and laboratory studies on MyChart before your provider has had a chance to review them.  We understand that in some cases there may be results that are confusing or concerning to you. Not all laboratory results come back in the same time frame and the provider may be waiting for multiple results in order to interpret others.  Please give [place] 48 hours in order for your provider to thoroughly review all the results before contacting the office for clarification of your results.   It was a pleasure to see you today!  Thank you for trusting me with your gastrointestinal care!

## 2023-10-12 ENCOUNTER — Other Ambulatory Visit (HOSPITAL_COMMUNITY): Payer: Self-pay

## 2023-10-17 NOTE — Progress Notes (Signed)
Agree with the assessment and plan as outlined by Boone Master, PA-C.

## 2023-10-18 ENCOUNTER — Encounter: Payer: Self-pay | Admitting: Gastroenterology

## 2023-10-18 ENCOUNTER — Other Ambulatory Visit (INDEPENDENT_AMBULATORY_CARE_PROVIDER_SITE_OTHER): Payer: 59

## 2023-10-18 ENCOUNTER — Ambulatory Visit: Payer: 59

## 2023-10-18 ENCOUNTER — Ambulatory Visit: Payer: 59 | Admitting: Gastroenterology

## 2023-10-18 VITALS — BP 103/69 | HR 72 | Temp 99.0°F | Resp 13 | Ht 66.0 in | Wt 143.0 lb

## 2023-10-18 DIAGNOSIS — R1013 Epigastric pain: Secondary | ICD-10-CM | POA: Diagnosis not present

## 2023-10-18 DIAGNOSIS — K449 Diaphragmatic hernia without obstruction or gangrene: Secondary | ICD-10-CM

## 2023-10-18 DIAGNOSIS — D72829 Elevated white blood cell count, unspecified: Secondary | ICD-10-CM | POA: Diagnosis not present

## 2023-10-18 DIAGNOSIS — R1310 Dysphagia, unspecified: Secondary | ICD-10-CM | POA: Diagnosis not present

## 2023-10-18 MED ORDER — SODIUM CHLORIDE 0.9 % IV SOLN: 500.0000 mL | INTRAVENOUS | Status: DC

## 2023-10-18 NOTE — Progress Notes (Signed)
Called to room to assist during endoscopic procedure.  Patient ID and intended procedure confirmed with present staff. Received instructions for my participation in the procedure from the performing physician.  

## 2023-10-18 NOTE — Op Note (Signed)
[facility] Patient Name: Sue Williams Procedure Date: 10/18/2023 4:33 PM MRN: [id number] Endoscopist: Lorin Picket E. Tomasa Rand , MD, [id number] Age: 43 Referring MD:  Date of Birth: [birth year] Gender: Female Account #: [id number] Procedure:                Upper GI endoscopy Indications:              Epigastric abdominal pain, Dysphagia Medicines:                Monitored Anesthesia Care Procedure:                Pre-Anesthesia Assessment:                           - Prior to the procedure, a History and Physical                            was performed, and patient medications and                            allergies were reviewed. The patient's tolerance of                            previous anesthesia was also reviewed. The risks                            and benefits of the procedure and the sedation                            options and risks were discussed with the patient.                            All questions were answered, and informed consent                            was obtained. Prior Anticoagulants: The patient has                            taken no anticoagulant or antiplatelet agents. ASA                            Grade Assessment: III - A patient with severe                            systemic disease. After reviewing the risks and                            benefits, the patient was deemed in satisfactory                            condition to undergo the procedure.                           After obtaining informed consent, the endoscope was  passed under direct vision. Throughout the                            procedure, the patient's blood pressure, pulse, and                            oxygen saturations were monitored continuously. The                            Olympus Scope [id number] was introduced through the                            mouth, and advanced to the second part of duodenum.                            The  upper GI endoscopy was accomplished without                            difficulty. The patient tolerated the procedure                            well. Scope In: Scope Out: Findings:                 The examined portions of the nasopharynx,                            oropharynx and larynx were normal.                           The examined esophagus was normal. Biopsies were                            obtained from the proximal and distal esophagus                            with cold forceps for histology of suspected                            eosinophilic esophagitis. Estimated blood loss was                            minimal.                           A 3 cm hiatal hernia was present.                           The entire examined stomach was normal. Biopsies                            were taken with a cold forceps for Helicobacter                            pylori testing. Estimated blood loss was minimal.  The examined duodenum was normal.                           The scope was withdrawn. Dilation with a 56 Fr                            Elease Hashimoto was attempted, but the lesion was not                            amenable to treatment at the cricopharyngeus with a                            Maloney dilator because the dilator could not be                            passed due to resistance in the oropharynx.                           Next, the endoscope was reinserted and a guidewire                            was placed in the stomach and the scope was                            withdrawn. Dilation was attempted, but the lesion                            was not amenable to treatment at the                            cricopharyngeus with a 54 Fr Savary dilator due to                            the dilator could not be passed due to resistance                            in the oropharynx. Complications:            No immediate complications. Estimated Blood  Loss:     Estimated blood loss was minimal. Estimated blood                            loss was minimal. Impression:               - The examined portions of the nasopharynx,                            oropharynx and larynx were normal.                           - Normal esophagus.                           - 3 cm hiatal hernia.                           -  Normal stomach. Biopsied.                           - Normal examined duodenum.                           - Unsuccessful attempts at dilation of the                            cricopharyngeus with Elease Hashimoto and Savary dilator.                           - No endoscopic abnormalities to explain patient's                            difficulty swallowing.                           - Presence of hiatal hernia can contribute to                            excessive belching and GERD symptoms. Recommendation:           - Patient has a contact number available for                            emergencies. The signs and symptoms of potential                            delayed complications were discussed with the                            patient. Return to normal activities tomorrow.                            Written discharge instructions were provided to the                            patient.                           - Resume previous diet.                           - Continue present medications.                           - Await pathology results.                           - Consider referral to ENT or speech pathology if                            patient continues to have issues swallowing. Chestina Komatsu E. Tomasa Rand, MD 10/18/2023 5:08:47 PM This report has been signed electronically.

## 2023-10-18 NOTE — Progress Notes (Signed)
History and Physical Interval Note:  10/18/2023 4:38 PM  Sue Williams  has presented today for endoscopic procedure(s), with the diagnosis of  Encounter Diagnosis  Name Primary?   Dysphagia, unspecified type Yes  .  The various methods of evaluation and treatment have been discussed with the patient and/or family. After consideration of risks, benefits and other options for treatment, the patient has consented to  the endoscopic procedure(s).   The patient's history has been reviewed, patient examined, no change in status, stable for endoscopic procedure(s).  I have reviewed the patient's chart and labs.  Questions were answered to the patient's satisfaction.     Amillion Scobee E. Tomasa Rand, MD [facility]

## 2023-10-18 NOTE — Progress Notes (Signed)
Sedate, gd SR, tolerated procedure well, VSS, report to RN 

## 2023-10-18 NOTE — Patient Instructions (Addendum)
Continue present medications. Await pathology results. Consider referral to ENT or speech pathology if patient continues to have issues swallowing.  Dr. Milas Hock office nurse will call you to set that up                            YOU HAD AN ENDOSCOPIC PROCEDURE TODAY AT THE [facility]:   Refer to the procedure report that was given to you for any specific questions about what was found during the examination.  If the procedure report does not answer your questions, please call your gastroenterologist to clarify.  If you requested that your care partner not be given the details of your procedure findings, then the procedure report has been included in a sealed envelope for you to review at your convenience later.  YOU SHOULD EXPECT: Some feelings of bloating in the abdomen. Passage of more gas than usual.  Walking can help get rid of the air that was put into your GI tract during the procedure and reduce the bloating.  Please Note:  You might notice some irritation and congestion in your nose or some drainage.  This is from the oxygen used during your procedure.  There is no need for concern and it should clear up in a day or so.  SYMPTOMS TO REPORT IMMEDIATELY:  Following upper endoscopy (EGD)  Vomiting of blood or coffee ground material  New chest pain or pain under the shoulder blades  Painful or persistently difficult swallowing  New shortness of breath  Fever of 100F or higher  Black, tarry-looking stools  For urgent or emergent issues, a gastroenterologist can be reached at any hour by calling [phone]. Do not use MyChart messaging for urgent concerns.    DIET:  We do recommend a small meal at first, but then you may proceed to your regular diet.  Drink plenty of fluids but you should avoid alcoholic beverages for 24 hours.  ACTIVITY:  You should plan to take it easy for the rest of today and you should NOT DRIVE or use heavy machinery until tomorrow (because  of the sedation medicines used during the test).    FOLLOW UP: Our staff will call the number listed on your records the next business day following your procedure.  We will call around 7:15- 8:00 am to check on you and address any questions or concerns that you may have regarding the information given to you following your procedure. If we do not reach you, we will leave a message.     If any biopsies were taken you will be contacted by phone or by letter within the next 1-3 weeks.  Please call us at [phone] if you have not heard about the biopsies in 3 weeks.    SIGNATURES/CONFIDENTIALITY: You and/or your care partner have signed paperwork which will be entered into your electronic medical record.  These signatures attest to the fact that that the information above on your After Visit Summary has been reviewed and is understood.  Full responsibility of the confidentiality of this discharge information lies with you and/or your care-partner.

## 2023-10-19 ENCOUNTER — Telehealth: Payer: Self-pay | Admitting: *Deleted

## 2023-10-19 ENCOUNTER — Other Ambulatory Visit (INDEPENDENT_AMBULATORY_CARE_PROVIDER_SITE_OTHER): Payer: 59

## 2023-10-19 DIAGNOSIS — D72829 Elevated white blood cell count, unspecified: Secondary | ICD-10-CM

## 2023-10-19 LAB — CBC WITH DIFFERENTIAL/PLATELET
Basophils Absolute: 0 10*3/uL (ref 0.0–0.1)
Basophils Relative: 0.4 % (ref 0.0–3.0)
Eosinophils Absolute: 0.1 10*3/uL (ref 0.0–0.7)
Eosinophils Relative: 1 % (ref 0.0–5.0)
HCT: 46.1 % — ABNORMAL HIGH (ref 36.0–46.0)
Hemoglobin: 15.1 g/dL — ABNORMAL HIGH (ref 12.0–15.0)
Lymphocytes Relative: 30.4 % (ref 12.0–46.0)
Lymphs Abs: 3 10*3/uL (ref 0.7–4.0)
MCHC: 32.9 g/dL (ref 30.0–36.0)
MCV: 94.9 fL (ref 78.0–100.0)
Monocytes Absolute: 0.7 10*3/uL (ref 0.1–1.0)
Monocytes Relative: 7.1 % (ref 3.0–12.0)
Neutro Abs: 6 10*3/uL (ref 1.4–7.7)
Neutrophils Relative %: 61.1 % (ref 43.0–77.0)
Platelets: 387 10*3/uL (ref 150.0–400.0)
RBC: 4.85 Mil/uL (ref 3.87–5.11)
RDW: 13.7 % (ref 11.5–15.5)
WBC: 9.8 10*3/uL (ref 4.0–10.5)

## 2023-10-19 NOTE — Telephone Encounter (Signed)
  Follow up Call-     10/18/2023    3:12 PM 03/31/2022    1:55 PM  Call back number  Post procedure Call Back phone  # [phone] cell  Permission to leave phone message Yes No     Patient questions:  Do you have a fever, pain , or abdominal swelling? No. Pain Score  0 *  Have you tolerated food without any problems? Yes.    Have you been able to return to your normal activities? Yes.    Do you have any questions about your discharge instructions: Diet   No. Medications  No. Follow up visit  No.  Do you have questions or concerns about your Care? No.  Actions: * If pain score is 4 or above: No action needed, pain <4.

## 2023-10-22 ENCOUNTER — Encounter: Payer: Self-pay | Admitting: Physician Assistant

## 2023-10-23 LAB — SURGICAL PATHOLOGY

## 2023-10-24 ENCOUNTER — Encounter: Payer: Self-pay | Admitting: Gastroenterology

## 2023-10-24 NOTE — Progress Notes (Signed)
Sue Williams,  The biopsies from your stomach and esophagus were all normal.  There was no evidence of H. pylori infection in the stomach and no evidence of eosinophilic esophagitis or reflux esophagitis in the esophagus. As discussed after your endoscopy, if your swallowing issues are not improving, we can send you to a speech-language pathologist for further evaluation of the oropharyngeal side of your swallowing.

## 2023-11-11 ENCOUNTER — Other Ambulatory Visit (HOSPITAL_COMMUNITY): Payer: Self-pay

## 2023-11-11 ENCOUNTER — Encounter (HOSPITAL_COMMUNITY): Payer: Self-pay | Admitting: Pharmacist

## 2023-11-12 ENCOUNTER — Other Ambulatory Visit: Payer: Self-pay

## 2023-11-23 DIAGNOSIS — Z1231 Encounter for screening mammogram for malignant neoplasm of breast: Secondary | ICD-10-CM | POA: Diagnosis not present

## 2023-11-23 LAB — HM MAMMOGRAPHY

## 2023-12-08 ENCOUNTER — Other Ambulatory Visit: Payer: Self-pay | Admitting: Physician Assistant

## 2023-12-10 ENCOUNTER — Other Ambulatory Visit (HOSPITAL_COMMUNITY): Payer: Self-pay

## 2023-12-10 ENCOUNTER — Other Ambulatory Visit: Payer: Self-pay

## 2023-12-10 MED ORDER — GABAPENTIN 100 MG PO CAPS
100.0000 mg | ORAL_CAPSULE | Freq: Two times a day (BID) | ORAL | 1 refills | Status: DC
  Filled 2023-12-10 – 2023-12-21 (×2): qty 180, 90d supply, fill #0

## 2023-12-20 ENCOUNTER — Ambulatory Visit: Payer: 59 | Admitting: Radiology

## 2023-12-20 VITALS — BP 102/74 | Wt 144.8 lb

## 2023-12-20 DIAGNOSIS — R6882 Decreased libido: Secondary | ICD-10-CM

## 2023-12-20 NOTE — Progress Notes (Signed)
   Sue Williams 1980/12/31 [id number]   History:  43 y.o. G4P0 presents to follow up after starting compounded testosterone. Libido has improved, has more energy, sleeping better but not great.   She originally c/o of hormonal changes, difficulty sleeping, decreased libido, mood swings, trouble concentrating, cognitive changes. Worse over the past few months. Scant spotting with POPs occasionally. Was originally switch to a POP because she was smoking, has not smoked in >5 years. Open to discussing HRT. Has tried gabapentin for sleep from PCP, doesn't like the side effects.   Gynecologic History Patient's last menstrual period was 12/20/2023 (exact date).     Obstetric History OB History  Gravida Para Term Preterm AB Living  4    4 0  SAB IAB Ectopic Multiple Live Births  1 3       # Outcome Date GA Lbr Len/2nd Weight Sex Type Anes PTL Lv  4 SAB 2020          3 IAB           2 IAB           1 IAB             The following portions of the patient's history were reviewed and updated as appropriate: allergies, current medications, past family history, past medical history, past social history, past surgical history, and problem list.  Review of Systems  All other systems reviewed and are negative.   Past medical history, past surgical history, family history and social history were all reviewed and documented in the EPIC chart.  Exam:  Vitals:   12/20/23 1034  BP: 102/74  Weight: 144 lb 12.8 oz (65.7 kg)   Body mass index is 23.37 kg/m.  Physical Exam Vitals and nursing note reviewed.  Constitutional:      Appearance: Normal appearance.  Pulmonary:     Effort: Pulmonary effort is normal.  Neurological:     Mental Status: She is alert.  Psychiatric:        Mood and Affect: Mood normal.        Thought Content: Thought content normal.        Judgment: Judgment normal.     Assessment/Plan:   1. Low libido (Primary) Will begin compounded Testosterone PLO gel  4% a pea sized amount to inner thigh nightly. Will contact with results of testosterone levels.   Arlie Solomons B WHNP-BC 10:57 AM 12/20/2023

## 2023-12-21 ENCOUNTER — Other Ambulatory Visit (HOSPITAL_COMMUNITY): Payer: Self-pay

## 2023-12-26 LAB — TESTOS,TOTAL,FREE AND SHBG (FEMALE)
Free Testosterone: 4.2 pg/mL (ref 0.1–6.4)
Sex Hormone Binding: 41.6 nmol/L (ref 17–124)
Testosterone, Total, LC-MS-MS: 36 ng/dL (ref 2–45)

## 2024-03-01 ENCOUNTER — Other Ambulatory Visit: Payer: Self-pay | Admitting: Physician Assistant

## 2024-03-03 ENCOUNTER — Other Ambulatory Visit: Payer: Self-pay

## 2024-03-03 ENCOUNTER — Other Ambulatory Visit (HOSPITAL_COMMUNITY): Payer: Self-pay

## 2024-03-03 MED ORDER — PANTOPRAZOLE SODIUM 40 MG PO TBEC
40.0000 mg | DELAYED_RELEASE_TABLET | Freq: Two times a day (BID) | ORAL | 3 refills | Status: DC
  Filled 2024-03-03: qty 60, 30d supply, fill #0
  Filled 2024-03-29: qty 60, 30d supply, fill #1
  Filled 2024-04-28: qty 60, 30d supply, fill #2
  Filled 2024-05-28: qty 60, 30d supply, fill #3

## 2024-04-10 ENCOUNTER — Encounter: Payer: Self-pay | Admitting: Radiology

## 2024-04-10 ENCOUNTER — Ambulatory Visit (INDEPENDENT_AMBULATORY_CARE_PROVIDER_SITE_OTHER): Payer: 59 | Admitting: Radiology

## 2024-04-10 ENCOUNTER — Other Ambulatory Visit (HOSPITAL_COMMUNITY)
Admission: RE | Admit: 2024-04-10 | Discharge: 2024-04-10 | Disposition: A | Discharge status: Home / Self Care | Source: Ambulatory Visit | Attending: Radiology | Admitting: Radiology

## 2024-04-10 ENCOUNTER — Other Ambulatory Visit (HOSPITAL_COMMUNITY): Payer: Self-pay

## 2024-04-10 VITALS — BP 112/68 | HR 83 | Ht 66.5 in | Wt 143.2 lb

## 2024-04-10 DIAGNOSIS — Z1331 Encounter for screening for depression: Secondary | ICD-10-CM

## 2024-04-10 DIAGNOSIS — Z3041 Encounter for surveillance of contraceptive pills: Secondary | ICD-10-CM | POA: Diagnosis not present

## 2024-04-10 DIAGNOSIS — N951 Menopausal and female climacteric states: Secondary | ICD-10-CM | POA: Diagnosis not present

## 2024-04-10 DIAGNOSIS — Z01419 Encounter for gynecological examination (general) (routine) without abnormal findings: Secondary | ICD-10-CM

## 2024-04-10 MED ORDER — NORETHINDRONE 0.35 MG PO TABS
1.0000 | ORAL_TABLET | Freq: Every day | ORAL | 4 refills | Status: AC
  Filled 2024-04-10 – 2024-05-24 (×4): qty 84, 84d supply, fill #0
  Filled 2024-08-15: qty 84, 84d supply, fill #1
  Filled 2024-10-13 – 2024-10-30 (×2): qty 84, 84d supply, fill #2

## 2024-04-10 NOTE — Progress Notes (Signed)
 Sue Williams [birth year] [id number]   History:  43 y.o. G4P0 presents for annual exam. Doing well on POPs and compounded testosterone . No new gyn concerns. Labs with PCP.  Gynecologic History Patient's last menstrual period was 03/27/2024 (exact date). Period Cycle (Days): 28 Period Duration (Days): 3 Period Pattern: Regular Menstrual Flow: Light Menstrual Control: Thin pad, Maxi pad Dysmenorrhea: (!) Mild Dysmenorrhea Symptoms: Cramping Contraception/Family planning: oral progesterone-only contraceptive Sexually active: yes Last Pap: 2020. Results were: normal Last mammogram: 11/23/23  Obstetric History OB History  Gravida Para Term Preterm AB Living  4    4 0  SAB IAB Ectopic Multiple Live Births  1 3       # Outcome Date GA Lbr Len/2nd Weight Sex Type Anes PTL Lv  4 SAB 2020          3 IAB           2 IAB           1 IAB                04/10/2024   10:22 AM 09/17/2023    8:59 AM 09/14/2022    9:00 AM  Depression screen PHQ 2/9  Decreased Interest 0 0 0  Down, Depressed, Hopeless 0 0 0  PHQ - 2 Score 0 0 0  Altered sleeping  2   Tired, decreased energy  1   Change in appetite  0   Feeling bad or failure about yourself   0   Trouble concentrating  0   Moving slowly or fidgety/restless  0   Suicidal thoughts  0   PHQ-9 Score  3   Difficult doing work/chores  Not difficult at all      The following portions of the patient's history were reviewed and updated as appropriate: allergies, current medications, past family history, past medical history, past social history, past surgical history, and problem list.  Review of Systems  All other systems reviewed and are negative.   Past medical history, past surgical history, family history and social history were all reviewed and documented in the EPIC chart.  Exam:  Vitals:   04/10/24 1021  BP: 112/68  Pulse: 83  SpO2: 98%  Weight: 143 lb 3.2 oz (65 kg)  Height: 5' 6.5 (1.689 m)   Body mass index is  22.77 kg/m.  Physical Exam Vitals and nursing note reviewed. Exam conducted with a chaperone present.  Constitutional:      Appearance: Normal appearance. She is normal weight.  HENT:     Head: Normocephalic and atraumatic.  Neck:     Thyroid : No thyroid  mass, thyromegaly or thyroid  tenderness.  Cardiovascular:     Rate and Rhythm: Regular rhythm.     Heart sounds: Normal heart sounds.  Pulmonary:     Effort: Pulmonary effort is normal.     Breath sounds: Normal breath sounds.  Chest:  Breasts:    Breasts are symmetrical.     Right: Normal. No inverted nipple, mass, nipple discharge, skin change or tenderness.     Left: Normal. No inverted nipple, mass, nipple discharge, skin change or tenderness.  Abdominal:     General: Abdomen is flat. Bowel sounds are normal.     Palpations: Abdomen is soft.  Genitourinary:    General: Normal vulva.     Vagina: Normal. No vaginal discharge, bleeding or lesions.     Cervix: Normal. No discharge or lesion.     Uterus: Normal. Not enlarged and  not tender.      Adnexa: Right adnexa normal and left adnexa normal.       Right: No mass, tenderness or fullness.         Left: No mass, tenderness or fullness.    Lymphadenopathy:     Upper Body:     Right upper body: No axillary adenopathy.     Left upper body: No axillary adenopathy.  Skin:    General: Skin is warm and dry.  Neurological:     Mental Status: She is alert and oriented to person, place, and time.  Psychiatric:        Mood and Affect: Mood normal.        Thought Content: Thought content normal.        Judgment: Judgment normal.      Darice Hoit, CMA present for exam  Assessment/Plan:   1. Well female exam with routine gynecological exam (Primary) - Cytology - PAP( [facility])  2. Perimenopausal symptoms Continue compounded testosterone , will call when she needs a refill  3. Encounter for surveillance of contraceptive pills - norethindrone  (NORLYDA ) 0.35 MG tablet; Take  1 tablet (0.35 mg total) by mouth daily.  Dispense: 84 tablet; Refill: 4  4. Depression screening negative    Return in about 1 year (around 04/10/2025) for Annual.  GINETTE SHASTA NOVAK WHNP-BC 10:39 AM 04/10/2024

## 2024-04-15 ENCOUNTER — Ambulatory Visit: Payer: Self-pay | Admitting: Radiology

## 2024-04-15 LAB — CYTOLOGY - PAP
Comment: NEGATIVE
Diagnosis: NEGATIVE
High risk HPV: NEGATIVE

## 2024-04-21 ENCOUNTER — Other Ambulatory Visit: Payer: Self-pay

## 2024-04-22 ENCOUNTER — Other Ambulatory Visit: Payer: Self-pay

## 2024-04-22 DIAGNOSIS — R6882 Decreased libido: Secondary | ICD-10-CM

## 2024-04-22 MED ORDER — NONFORMULARY OR COMPOUNDED ITEM: 5 refills | Status: AC

## 2024-04-22 NOTE — Telephone Encounter (Signed)
 Received refill request via fax  Med refill request:Testosterone  Last AEX: 04/10/24 JC Next AEX: 04/14/25 JC Last MMG (if hormonal med) 11/23/23 Refill authorized: Last Rx sent #[phone] with 5 refills on 09/21/23 JC. Please approve or deny

## 2024-04-28 ENCOUNTER — Other Ambulatory Visit (HOSPITAL_COMMUNITY): Payer: Self-pay

## 2024-04-28 ENCOUNTER — Other Ambulatory Visit: Payer: Self-pay

## 2024-05-24 ENCOUNTER — Other Ambulatory Visit (HOSPITAL_COMMUNITY): Payer: Self-pay

## 2024-07-09 DIAGNOSIS — H5213 Myopia, bilateral: Secondary | ICD-10-CM | POA: Diagnosis not present

## 2024-07-09 DIAGNOSIS — H524 Presbyopia: Secondary | ICD-10-CM | POA: Diagnosis not present

## 2024-08-15 ENCOUNTER — Other Ambulatory Visit: Payer: Self-pay | Admitting: Physician Assistant

## 2024-08-15 ENCOUNTER — Other Ambulatory Visit: Payer: Self-pay

## 2024-08-18 ENCOUNTER — Other Ambulatory Visit (HOSPITAL_COMMUNITY): Payer: Self-pay

## 2024-08-18 MED ORDER — PANTOPRAZOLE SODIUM 40 MG PO TBEC
40.0000 mg | DELAYED_RELEASE_TABLET | Freq: Two times a day (BID) | ORAL | 3 refills | Status: AC
  Filled 2024-08-18: qty 60, 30d supply, fill #0
  Filled 2024-09-13: qty 60, 30d supply, fill #1

## 2024-09-17 ENCOUNTER — Encounter: Payer: Self-pay | Admitting: Physician Assistant

## 2024-09-17 ENCOUNTER — Ambulatory Visit: Admitting: Physician Assistant

## 2024-09-17 ENCOUNTER — Other Ambulatory Visit (HOSPITAL_COMMUNITY): Payer: Self-pay

## 2024-09-17 VITALS — BP 122/80 | HR 64 | Temp 98.0°F | Ht 66.5 in | Wt 145.0 lb

## 2024-09-17 DIAGNOSIS — K219 Gastro-esophageal reflux disease without esophagitis: Secondary | ICD-10-CM | POA: Diagnosis not present

## 2024-09-17 DIAGNOSIS — H608X3 Other otitis externa, bilateral: Secondary | ICD-10-CM | POA: Diagnosis not present

## 2024-09-17 DIAGNOSIS — Z0000 Encounter for general adult medical examination without abnormal findings: Secondary | ICD-10-CM | POA: Diagnosis not present

## 2024-09-17 LAB — CBC WITH DIFFERENTIAL/PLATELET
Basophils Absolute: 0.1 K/uL (ref 0.0–0.1)
Basophils Relative: 0.6 % (ref 0.0–3.0)
Eosinophils Absolute: 0.1 K/uL (ref 0.0–0.7)
Eosinophils Relative: 0.8 % (ref 0.0–5.0)
HCT: 45.5 % (ref 36.0–46.0)
Hemoglobin: 15.1 g/dL — ABNORMAL HIGH (ref 12.0–15.0)
Lymphocytes Relative: 21.2 % (ref 12.0–46.0)
Lymphs Abs: 2.8 K/uL (ref 0.7–4.0)
MCHC: 33.1 g/dL (ref 30.0–36.0)
MCV: 93.7 fl (ref 78.0–100.0)
Monocytes Absolute: 0.7 K/uL (ref 0.1–1.0)
Monocytes Relative: 4.9 % (ref 3.0–12.0)
Neutro Abs: 9.7 K/uL — ABNORMAL HIGH (ref 1.4–7.7)
Neutrophils Relative %: 72.5 % (ref 43.0–77.0)
Platelets: 361 K/uL (ref 150.0–400.0)
RBC: 4.86 Mil/uL (ref 3.87–5.11)
RDW: 14.1 % (ref 11.5–15.5)
WBC: 13.4 K/uL — ABNORMAL HIGH (ref 4.0–10.5)

## 2024-09-17 LAB — LIPID PANEL
Cholesterol: 225 mg/dL — ABNORMAL HIGH (ref 28–200)
HDL: 58.9 mg/dL
LDL Cholesterol: 155 mg/dL — ABNORMAL HIGH (ref 10–99)
NonHDL: 166.56
Total CHOL/HDL Ratio: 4
Triglycerides: 58 mg/dL (ref 10.0–149.0)
VLDL: 11.6 mg/dL (ref 0.0–40.0)

## 2024-09-17 LAB — COMPREHENSIVE METABOLIC PANEL WITH GFR
ALT: 13 U/L (ref 3–35)
AST: 15 U/L (ref 5–37)
Albumin: 4.7 g/dL (ref 3.5–5.2)
Alkaline Phosphatase: 60 U/L (ref 39–117)
BUN: 12 mg/dL (ref 6–23)
CO2: 26 meq/L (ref 19–32)
Calcium: 9.3 mg/dL (ref 8.4–10.5)
Chloride: 103 meq/L (ref 96–112)
Creatinine, Ser: 0.61 mg/dL (ref 0.40–1.20)
GFR: 108.65 mL/min
Glucose, Bld: 89 mg/dL (ref 70–99)
Potassium: 4.4 meq/L (ref 3.5–5.1)
Sodium: 137 meq/L (ref 135–145)
Total Bilirubin: 1.3 mg/dL — ABNORMAL HIGH (ref 0.2–1.2)
Total Protein: 6.5 g/dL (ref 6.0–8.3)

## 2024-09-17 MED ORDER — FLUOCINOLONE ACETONIDE 0.01 % OT OIL
5.0000 [drp] | TOPICAL_OIL | Freq: Two times a day (BID) | OTIC | 0 refills | Status: AC
  Filled 2024-09-17: qty 20, 14d supply, fill #0

## 2024-09-17 NOTE — Progress Notes (Signed)
 "  Subjective:    Sue Williams is a 43 y.o. female and is here for a comprehensive physical exam.  HPI  There are no preventive care reminders to display for this patient.  Discussed the use of AI scribe software for clinical note transcription with the patient, who gave verbal consent to proceed.  History of Present Illness   Sue Williams is a 43 year old female who presents for a follow-up visit.  She started testosterone  cream from her gynecologist, with pre- and post-treatment labs confirming stabilized hormone levels and no reported side effects.  She continues pantoprazole  once daily for GERD, which worsens with stress. She is off sleep medication and notes improved sleep.  She exercises regularly and attributes weight gain to muscle, as clothing size is unchanged. She follows a healthy diet and takes calcium, magnesium, zinc, vitamin D , and B12 due to her vegetarian diet.  She has intermittent ear drainage and irritation with dryness and crusting. Prior drying drops worsened the dryness.  Her mother has osteoporosis and takes a monthly bisphosphonate. She focuses on bone health with adequate calcium and vitamin D  and strength training.  She denies joint pain, numbness, tingling, tremor, swelling, or significant bowel changes. Her mood is stable. She drinks alcohol about one drink every two months.       Health Maintenance: Immunizations -- UpToDate  Colonoscopy -- UpToDate  Mammogram -- UpToDate  PAP -- UpToDate  Bone Density -- N/A  Diet -- healthy diet Exercise -- regularly  Sleep habits -- overall improved with hormone replacement therapy  Mood -- stable overall   UTD with dentist? - yes UTD with eye doctor? - yes  Weight history: Wt Readings from Last 10 Encounters:  09/17/24 145 lb (65.8 kg)  04/10/24 143 lb 3.2 oz (65 kg)  12/20/23 144 lb 12.8 oz (65.7 kg)  10/18/23 143 lb (64.9 kg)  10/11/23 143 lb (64.9 kg)  09/21/23 142 lb (64.4 kg)   09/17/23 142 lb (64.4 kg)  08/29/23 143 lb (64.9 kg)  04/10/23 142 lb (64.4 kg)  09/14/22 139 lb 8 oz (63.3 kg)   Body mass index is 23.05 kg/m. Patient's last menstrual period was 08/24/2024.  Alcohol use:  reports current alcohol use.  Tobacco use:  Tobacco Use: Medium Risk (09/17/2024)   Patient History    Smoking Tobacco Use: Former    Smokeless Tobacco Use: Never    Passive Exposure: Past   Eligible for lung cancer screening? no     09/17/2024    8:51 AM  Depression screen PHQ 2/9  Decreased Interest 0  Down, Depressed, Hopeless 0  PHQ - 2 Score 0     Other providers/specialists: Patient Care Team: Job Lukes, [place] as PCP - General (Physician Assistant) Ginette Shasta NOVAK, NP as Nurse Practitioner (Obstetrics and Gynecology)    PMHx, SurgHx, SocialHx, Medications, and Allergies were reviewed in the Visit Navigator and updated as appropriate.   Past Medical History:  Diagnosis Date   Allergy    Anxiety    Depression    Genetic testing of female 07/11/2023   Sebaceous cyst of skin of left breast    around areola   Urinary frequency      Past Surgical History:  Procedure Laterality Date   BREAST CYST EXCISION Left 12/10/2014   Procedure: CYST EXCISION LEFT BREAST AEREOLA;  Surgeon: Jina Nephew, MD;  Location: [facility];  Service: General;  Laterality: Left;   NASAL SINUS SURGERY  09/25/2006  TONSILLECTOMY     WISDOM TOOTH EXTRACTION       Family History  Problem Relation Age of Onset   COPD Mother    Dementia Mother    Heart disease Father    Diabetes Maternal Grandmother    Heart disease Maternal Grandfather    Colon cancer Neg Hx    Esophageal cancer Neg Hx    Liver cancer Neg Hx    Stomach cancer Neg Hx     Social History[1]  Review of Systems:   Review of Systems  Constitutional:  Negative for chills, fever, malaise/fatigue and weight loss.  HENT:  Negative for hearing loss, sinus pain and sore throat.    Respiratory:  Negative for cough and hemoptysis.   Cardiovascular:  Negative for chest pain, palpitations, leg swelling and PND.  Gastrointestinal:  Negative for abdominal pain, constipation, diarrhea, heartburn, nausea and vomiting.  Genitourinary:  Negative for dysuria, frequency and urgency.  Musculoskeletal:  Negative for back pain, myalgias and neck pain.  Skin:  Negative for itching and rash.  Neurological:  Negative for dizziness, tingling, seizures and headaches.  Endo/Heme/Allergies:  Negative for polydipsia.  Psychiatric/Behavioral:  Negative for depression. The patient is not nervous/anxious.     Objective:   BP 122/80   Pulse 64   Temp 98 F (36.7 C) (Temporal)   Ht 5' 6.5 (1.689 m)   Wt 145 lb (65.8 kg)   LMP 08/24/2024   SpO2 99%   BMI 23.05 kg/m  Body mass index is 23.05 kg/m.   General Appearance:    Alert, cooperative, no distress, appears stated age  Head:    Normocephalic, without obvious abnormality, atraumatic  Eyes:    PERRL, conjunctiva/corneas clear, EOM's intact, fundi    benign, both eyes  Ears:    Normal TM's and external ear canals, both ears  Nose:   Nares normal, septum midline, mucosa normal, no drainage    or sinus tenderness  Throat:   Lips, mucosa, and tongue normal; teeth and gums normal  Neck:   Supple, symmetrical, trachea midline, no adenopathy;    thyroid :  no enlargement/tenderness/nodules; no carotid   bruit or JVD  Back:     Symmetric, no curvature, ROM normal, no CVA tenderness  Lungs:     Clear to auscultation bilaterally, respirations unlabored  Chest Wall:    No tenderness or deformity   Heart:    Regular rate and rhythm, S1 and S2 normal, no murmur, rub or gallop  Breast Exam:    Deferred   Abdomen:     Soft, non-tender, bowel sounds active all four quadrants,    no masses, no organomegaly  Genitalia:    Deferred Normal female without lesion, discharge or tenderness  Extremities:   Extremities normal, atraumatic, no  cyanosis or edema  Pulses:   2+ and symmetric all extremities  Skin:   Skin color, texture, turgor normal, no rashes or lesions  Lymph nodes:   Cervical, supraclavicular, and axillary nodes normal  Neurologic:   CNII-XII intact, normal strength, sensation and reflexes    throughout    Assessment/Plan:   Assessment and Plan    Woman's Wellness Visit Routine wellness visit with stable family history and lifestyle. Mood and blood pressure well-managed. Up to date on screenings. Vitamin D  supplementation advised due to family history of osteoporosis. - Continue regular exercise and healthy diet. - Maintain current alcohol intake. - Continue vitamin D  supplementation with food. - Schedule cholesterol panel within the next three months.  Gastroesophageal reflux disease GERD managed with pantoprazole , effective in symptom control. - Continue pantoprazole  once daily.  Chronic eczematous otitis externa of both ears  Chronic ear drainage and irritation, likely dermatitis. Previous acetic acid  drops may have exacerbated symptoms. - Prescribed steroid-containing ear drops to reduce dermatitis. - Consider follow up with dermatology regarding this issue       Lucie Buttner, PA-C [facility]           [1]  Social History Tobacco Use   Smoking status: Former    Current packs/day: 0.50    Average packs/day: 0.5 packs/day for 10.0 years (5.0 ttl pk-yrs)    Types: Cigarettes    Passive exposure: Past   Smokeless tobacco: Never   Tobacco comments:    occ  Vaping Use   Vaping status: Never Used  Substance Use Topics   Alcohol use: Yes    Comment: very rare; one drink every few months   Drug use: No   "

## 2024-09-17 NOTE — Patient Instructions (Signed)
 It was great to see you!  Please go to the lab for blood work.   Our office will call you with your results unless you have chosen to receive results via MyChart.  If your blood work is normal we will follow-up each year for physicals and as scheduled for chronic medical problems.  If anything is abnormal we will treat accordingly and get you in for a follow-up.  Take care,  Lelon Mast

## 2024-09-19 ENCOUNTER — Other Ambulatory Visit: Payer: Self-pay

## 2024-09-19 ENCOUNTER — Ambulatory Visit: Payer: Self-pay | Admitting: Physician Assistant

## 2024-09-19 DIAGNOSIS — D72829 Elevated white blood cell count, unspecified: Secondary | ICD-10-CM

## 2024-09-24 ENCOUNTER — Encounter: Payer: 59 | Admitting: Physician Assistant

## 2024-10-13 ENCOUNTER — Other Ambulatory Visit: Payer: Self-pay

## 2024-10-21 ENCOUNTER — Other Ambulatory Visit

## 2024-10-30 ENCOUNTER — Ambulatory Visit: Payer: Self-pay | Admitting: Physician Assistant

## 2024-10-30 ENCOUNTER — Other Ambulatory Visit: Payer: Self-pay

## 2024-10-30 ENCOUNTER — Other Ambulatory Visit

## 2024-10-30 DIAGNOSIS — D72829 Elevated white blood cell count, unspecified: Secondary | ICD-10-CM

## 2024-10-30 LAB — CBC WITH DIFFERENTIAL/PLATELET
Basophils Absolute: 0.1 10*3/uL (ref 0.0–0.1)
Basophils Relative: 0.8 % (ref 0.0–3.0)
Eosinophils Absolute: 0.2 10*3/uL (ref 0.0–0.7)
Eosinophils Relative: 1.7 % (ref 0.0–5.0)
HCT: 41.3 % (ref 36.0–46.0)
Hemoglobin: 14.2 g/dL (ref 12.0–15.0)
Lymphocytes Relative: 38.4 % (ref 12.0–46.0)
Lymphs Abs: 3.7 10*3/uL (ref 0.7–4.0)
MCHC: 34.3 g/dL (ref 30.0–36.0)
MCV: 92.2 fl (ref 78.0–100.0)
Monocytes Absolute: 0.7 10*3/uL (ref 0.1–1.0)
Monocytes Relative: 7.4 % (ref 3.0–12.0)
Neutro Abs: 4.9 10*3/uL (ref 1.4–7.7)
Neutrophils Relative %: 51.7 % (ref 43.0–77.0)
Platelets: 344 10*3/uL (ref 150.0–400.0)
RBC: 4.48 Mil/uL (ref 3.87–5.11)
RDW: 13.5 % (ref 11.5–15.5)
WBC: 9.5 10*3/uL (ref 4.0–10.5)

## 2025-04-14 ENCOUNTER — Ambulatory Visit: Admitting: Radiology

## 2025-04-22 ENCOUNTER — Ambulatory Visit: Admitting: Radiology

## 2025-09-21 ENCOUNTER — Encounter: Admitting: Physician Assistant
